# Patient Record
Sex: Female | Born: 1980 | Race: Black or African American | Hispanic: No | Marital: Married | Smoking: Never smoker
Health system: Southern US, Community
[De-identification: ages and names within clinical notes are randomized; demographics above are authoritative.]

## PROBLEM LIST (undated history)

## (undated) ENCOUNTER — Inpatient Hospital Stay (HOSPITAL_COMMUNITY): Payer: Self-pay

## (undated) DIAGNOSIS — O26899 Other specified pregnancy related conditions, unspecified trimester: Secondary | ICD-10-CM

## (undated) DIAGNOSIS — R12 Heartburn: Secondary | ICD-10-CM

## (undated) DIAGNOSIS — R002 Palpitations: Secondary | ICD-10-CM

## (undated) DIAGNOSIS — D649 Anemia, unspecified: Secondary | ICD-10-CM

## (undated) HISTORY — DX: Palpitations: R00.2

---

## 2001-05-12 ENCOUNTER — Emergency Department (HOSPITAL_COMMUNITY): Admission: EM | Admit: 2001-05-12 | Discharge: 2001-05-12 | Payer: Self-pay | Admitting: Internal Medicine

## 2004-01-22 ENCOUNTER — Inpatient Hospital Stay (HOSPITAL_COMMUNITY): Admission: AD | Admit: 2004-01-22 | Discharge: 2004-01-26 | Payer: Self-pay | Admitting: Obstetrics and Gynecology

## 2006-02-12 ENCOUNTER — Emergency Department (HOSPITAL_COMMUNITY): Admission: EM | Admit: 2006-02-12 | Discharge: 2006-02-12 | Payer: Self-pay | Admitting: Emergency Medicine

## 2006-11-05 ENCOUNTER — Encounter: Payer: Self-pay | Admitting: Obstetrics & Gynecology

## 2006-11-05 ENCOUNTER — Inpatient Hospital Stay (HOSPITAL_COMMUNITY): Admission: AD | Admit: 2006-11-05 | Discharge: 2006-11-08 | Payer: Self-pay | Admitting: Obstetrics & Gynecology

## 2010-03-14 ENCOUNTER — Other Ambulatory Visit: Payer: Self-pay | Admitting: Obstetrics & Gynecology

## 2010-03-14 ENCOUNTER — Other Ambulatory Visit (HOSPITAL_COMMUNITY)
Admission: RE | Admit: 2010-03-14 | Discharge: 2010-03-14 | Disposition: A | Discharge status: Home / Self Care | Payer: BC Managed Care – PPO | Source: Ambulatory Visit | Attending: Obstetrics & Gynecology | Admitting: Obstetrics & Gynecology

## 2010-03-14 DIAGNOSIS — Z113 Encounter for screening for infections with a predominantly sexual mode of transmission: Secondary | ICD-10-CM | POA: Insufficient documentation

## 2010-03-14 DIAGNOSIS — Z01419 Encounter for gynecological examination (general) (routine) without abnormal findings: Secondary | ICD-10-CM | POA: Insufficient documentation

## 2010-05-28 NOTE — Discharge Summary (Signed)
Denise Cochran, Denise Cochran               ACCOUNT NO.:  [id number]   MEDICAL RECORD NO.:  [id number]          PATIENT TYPE:  INP   LOCATION:  9133                          FACILITY:  [facility]   PHYSICIAN:  Lazaro Arms, M.D.   DATE OF BIRTH:  March 20, 1980   DATE OF ADMISSION:  11/05/2006  DATE OF DISCHARGE:  11/08/2006                               DISCHARGE SUMMARY   PROCEDURE:  Patient had a repeat cesarean section by Dr. Despina Hidden in which  she delivered a viable female infant through low transverse cesarean  section.   HOSPITAL COURSE:  Was uneventful.   LABS:  Hemoglobin on October 25 was 9 and hematocrit 26.   PHYSICAL EXAM TODAY:  VITAL SIGNS:  Stable.  HEART:  Regular rhythm and rate.  LUNGS:  Clear to auscultation bilaterally.  ABDOMEN:  Soft, nontender with bowel sounds noted in all four quadrants.  Incision is dry, intact; no redness, swelling or discharge.  FUNDUS:  Firm, at -2 __________ .  There was a scant amount of lochia.  DTRs are 2+.  There is trace edema in lower extremities.   DISCHARGE MEDICATIONS:  Are as follows:  1. Repliva one p.o. daily.  2. Lortab 5/500 one p.o. q.4 hours p.r.n. pain.  3. Motrin 600 one p.o. q.6 to 8 hours p.r.n. cramping.   ASSESSMENT:  Anemia status post cesarean section; stable, postoperative  day three.   PLAN:  We are going to discharge her home.  She is to follow up at  [facility] on Thursday, October 30 for staple removal.  She knows to  call the office if there are any problems.      Zerita Boers, N.M.      Lazaro Arms, M.D.  Electronically Signed    DL/MEDQ  D:  16/10/9602  T:  11/08/2006  Job:  [id number]

## 2010-05-28 NOTE — H&P (Signed)
NAME:  Denise Cochran, Denise Cochran               ACCOUNT NO.:  [id number]   MEDICAL RECORD NO.:  [id number]          PATIENT TYPE:  INP   LOCATION:  NA                            [facility]   PHYSICIAN:  Lazaro Arms, M.D.   DATE OF BIRTH:  1980-09-12   DATE OF ADMISSION:  DATE OF DISCHARGE:  LH                              HISTORY & PHYSICAL   HISTORY OF THE PRESENT ILLNESS:  The patient is a 30 year old African-  American female, gravida 2, para 1, abortus 0, with an expected date of  delivery of November 09, 2006 at 39.[redacted] weeks gestation who is admitted for  repeat C section.  I had to performed a C section on her back in January  2006 for secondary arrest of dilatation and descent of a 7-pound 13-  ounce infant.  This pregnancy has been uncomplicated.  She really has  had no problem  throughout this pregnancy at all.   PAST MEDICAL HISTORY:  The past medical history is negative.   PAST SURGICAL HISTORY:  Cesarean section.   PAST OBSTETRICAL HISTORY:  Cesarean section.   ALLERGIES:  None.   MEDICATIONS:  Prenatal vitamins.   REVIEW OF SYSTEMS:  The review of systems is negative.   PRENATAL LABORATORY DATA:  Blood type is O positive.  Varicella immune.  Rubella immune.  Urine drug screen was negative.  Hepatitis B was  negative.  HIV was nonreactive.  HSV was negative times two for one and  two.  Human papillomavirus was negative.  Serology nonreactive.  Pap was  normal.  GC and Chlamydia were negative time two.  AFP was normal.  Group B Strep was negative.  Glucola was 76.   PHYSICAL EXAMINATION:  HEENT:  The head, eyes, ears, nose and throat are  negative.  NECK:  The thyroid is normal.  LUNGS:  The lungs are clear.  HEART:  The heart shows a regular rate and rhythm without regurg or  gallop.  BREASTS:  The breasts are without mass or skin changes.  ABDOMEN:  The abdominal height is 38 cm.  VAGINAL EXAMINATION:  The cervix is long, thick and closed, posterior.  EXTREMITIES:   The extremities are warm with 1+ edema.  NEUROLOGIC:  The neurological exam is grossly intact.   IMPRESSION:  1. Intrauterine pregnancy at 39.[redacted] weeks gestation.  2. Previous cesarean section.  3. Declines trial of labor.   PLAN:  The patient is admitted for a repeat cesarean section.  She  understands the risks, benefits, indications and alternatives, and we  will proceed.      Lazaro Arms, M.D.  Electronically Signed     LHE/MEDQ  D:  11/04/2006  T:  11/05/2006  Job:  [id number]

## 2010-05-28 NOTE — Op Note (Signed)
Denise Cochran, Denise Cochran               ACCOUNT NO.:  [id number]   MEDICAL RECORD NO.:  [id number]          PATIENT TYPE:  INP   LOCATION:  9199                          [facility]ACILITY:  [facility]   PHYSICIAN:  Lazaro Arms, M.D.   DATE OF BIRTH:  September 03, 1980   DATE OF PROCEDURE:  11/05/2006  DATE OF DISCHARGE:                               OPERATIVE REPORT   PREOPERATIVE DIAGNOSIS:  1. Intrauterine pregnancy at 23 1/[redacted] weeks gestation.  2. Previous cesarean section.  3. Declines a trial of labor.   POSTOPERATIVE DIAGNOSIS:  1. Intrauterine pregnancy at 80 1/[redacted] weeks gestation.  2. Previous cesarean section.  3. Declines a trial of labor.   PROCEDURE:  Repeat cesarean section.   SURGEON:  Lazaro Arms, M.D.   ASSISTANT:  Dr. Yetta Barre   ANESTHESIA:  Spinal.   FINDINGS:  Over a low transverse hysterotomy incision was delivered a  viable female infant at 29 with Apgars of 8 and 9 with weight to be  determined in the nursery.  There was a three vessel cord.  Cord blood  and cord gas were sent.  Cord gas had a pH 7.25.  The uterus, tubes, and  ovaries were normal.   DESCRIPTION OF PROCEDURE:  The patient was taken to the operating room  and placed in the sitting position where she underwent a spinal  anesthetic.  She was  placed in a supine position and prepped and draped  in the usual sterile fashion.  A Pfannenstiel skin incision was made and  carried down sharply to the rectus fascia which was scored in the  midline and extended laterally.  The fascia was taken off the muscles  superiorly and inferiorly without difficulty.  The muscles were divided,  the peritoneal cavity was entered.  An Alexis self-retaining wound  retractor was placed.  A bladder blade was placed.  A vesicouterine  serosal flap was created.  A low transverse hysterotomy incision was  made.  Over this incision was delivered a viable female infant at 65  with Apgars of 8 and 9 weighing, I believe, 6 pounds 12 ounces.   There  was a three vessel cord.  Cord blood and cord gas were sent.  Cord pH  was 7.25.  The infant underwent routine neonatal resuscitation.  The  placenta was delivered spontaneously and sent to pathology for  evaluation.  The uterus was wiped clean with a clean lap pad.  It was  closed in two layers, the first being a running interlocking layer, the  second being an imbricating layer.  There were multiple figure-of-eight  sutures placed in a venous plexus on the right side with good hemostasis  as a result.  The pelvis was irrigated vigorously.  The muscles and  peritoneum were reapproximated loosely.  The fascia was closed using 0  Vicryl running, subcutaneous tissues were made hemostatic and irrigated,  the skin was closed using skin staples.  The  patient tolerated the procedure well.  She experienced 800 mL of blood  loss.  She was taken to the recovery room in good, stable condition.  All counts were correct x3.  She received Ancef prophylactically after  the cord was clamped.      Lazaro Arms, M.D.  Electronically Signed     LHE/MEDQ  D:  11/05/2006  T:  11/05/2006  Job:  [id number]

## 2010-05-31 NOTE — H&P (Signed)
NAMEBULA, CAVALIERI               ACCOUNT NO.:  [id number]   MEDICAL RECORD NO.:  [id number]          PATIENT TYPE:  INP   LOCATION:  A401                          FACILITY:  [facility]   PHYSICIAN:  Lazaro Arms, M.D.   DATE OF BIRTH:  May 10, 1980   DATE OF ADMISSION:  01/22/2003  DATE OF DISCHARGE:  LH                                HISTORY & PHYSICAL   CHIEF COMPLAINT:  Induction of labor due to term pregnancy and favorable  cervix.   HISTORY OF PRESENT ILLNESS:  Denise Cochran is a 30 year old, gravida 1, para 0,  with an EDC of January 25, 2004, based on a last menstrual period and  correlating first trimester ultrasound; secondary trimester ultrasound  placed EDC at January 18, 2004, placing her at approximately [redacted] weeks  gestation. Tamika began prenatal care early in her first trimester. She has  had basically an uneventful pregnancy course. Total weight gain has been 35  pounds with appropriate fundal height growth. Blood pressures have been  anywhere from the 120s to 140s over 70s to 90s, although the past few weeks  it has been in the 118/70 range.   PRENATAL LABORATORY:  Blood type O positive, rubella immune.  HBSAG, HIV,  RPR, gonorrhea, Chlamydia, HSV are all negative. She is positive for GBS.  One-hour GGT was normal. She is a sickle cell trait carrier. The status of  the father of the baby is unknown.   PAST MEDICAL HISTORY:  Positive for sickle cell AC trait.   PAST SURGICAL HISTORY:  Benign.   ALLERGIES:  No known drug allergies.   MEDICATIONS:  Prenatal vitamins.   SOCIAL HISTORY:  Single, lives with her mother, and works at a day care. The  father of the baby is involved and supportive.   FAMILY HISTORY:  Mother has hypertension. Paternal twin sister has sickle  cell disease.   PHYSICAL EXAMINATION:  HEENT: Within normal limits.  HEART: Regular rate and rhythm.  LUNGS: Clear.  ABDOMEN: Soft and nontender. Fundal height is 40 cm today. Estimated fetal  weight is  about 8 pounds. Cervix 1-2 cm, 50%, soft, -1 station, vertex  presentation.  EXTREMITIES: Trace edema.   IMPRESSION:  Intrauterine pregnancy at 40 weeks, induction of labor.   PLAN:  Foley bulb induction on January 9th with plan for Pitocin and AROM on  the 10th. The patient will probably have an epidural.     Rita   RRK/MEDQ  D:  01/18/2004  T:  01/18/2004  Job:  [id number]

## 2010-05-31 NOTE — Op Note (Signed)
NAMESUELLYN, MEENAN               ACCOUNT NO.:  [id number]   MEDICAL RECORD NO.:  [id number]          PATIENT TYPE:  INP   LOCATION:  LDR1                          FACILITY:  [facility]   PHYSICIAN:  Lazaro Arms, M.D.   DATE OF BIRTH:  Jan 27, 1980   DATE OF PROCEDURE:  01/23/2004  DATE OF DISCHARGE:                                 OPERATIVE REPORT   PROCEDURE:  Epidural placement.   SURGEON:  Lazaro Arms, M.D.   INDICATIONS FOR PROCEDURE:  The patient is a 30 year old gravida 1, in  active phase of labor, requesting an epidural be placed.   DESCRIPTION OF PROCEDURE:  The patient is placed in the sitting position.  The L2-L3 interspace is identified.  A Betadine prep was used.  Lidocaine 1%  was injected as a local anesthetic.  A #17 gauge Tuohy needle was used with  a loss of resistance technique employed, and the epidural space was found  with one pass without difficulty.  Bupivacaine 0.125%, 10 mL, was given as a  test dose without ill effects.  The epidural catheter is fed through the  tubing needle 5 cm in the epidural space.  An additional 10 mL of 0.125%  bupivacaine is given as a bolus.  The patient tolerated that as well.  It  was taped down 5 cm into the epidural space.  Continuous infusion of 0.125%  bupivacaine in 2 mcg of fentanyl was begun at 12 mL per hour.  The patient  had no ill effects.  The fetal heart rate tracing was stable.     Luth   LHE/MEDQ  D:  01/23/2004  T:  01/23/2004  Job:  [id number]

## 2010-05-31 NOTE — Op Note (Signed)
[name], Denise Cochran               ACCOUNT NO.:  [id number]   MEDICAL RECORD NO.:  [id number]          PATIENT TYPE:  INP   LOCATION:  A401                          FACILITY:  [facility]   PHYSICIAN:  Lazaro Arms, M.D.   DATE OF BIRTH:  1980/01/28   DATE OF PROCEDURE:  01/23/2004  DATE OF DISCHARGE:  01/26/2004                                 OPERATIVE REPORT   PREOPERATIVE DIAGNOSES:  1.  Intrauterine pregnancy at 40 weeks' gestation.  2.  Secondary arrest of dilatation and descent in the first stage of labor.   POSTOPERATIVE DIAGNOSES:  1.  Intrauterine pregnancy at 40 weeks' gestation.  2.  Secondary arrest of dilatation and descent in the first stage of labor.   PROCEDURE:  Primary low transverse cesarean section.   SURGEON:  Lazaro Arms, M.D.   ANESTHESIA:  Epidural.   The patient got to 5, 90, -1, and did not proceed any further.  As a result,  I proceeded with a primary C-section.  She delivered a viable infant with  Apgars of 8 and 9, weight to be determined in the nursery.  There was a  three-vessel cord.  Both cord blood and cord gas were sent.  The placenta  was normal and intact.  The uterus, tubes, and ovaries were all normal.   DESCRIPTION OF OPERATION:  The patient was taken to the operating room and  placed in the supine position with a roll under her right hip.  She had an  epidural that was working.  Betadine prep was used.  A Pfannenstiel skin  incision was made, carried down sharply to the rectus fascia, which was  scored in the midline and extended laterally.  The fascia was taken off the  muscle superiorly and inferiorly.  Muscles were divided, peritoneal cavity  was entered, bladder blade was placed.  A vesicouterine serosal flap was  created.  A low transverse hysterotomy incision.  Over the incision was  delivered a viable infant, Apgars 8 and 9, weight to be determined in the  nursery.  There was a three-vessel cord.  Cord blood, cord gas was sent.  The pediatrician was in attendance for routine neonatal resuscitation.  The  uterus was exteriorized, placenta delivered.  The uterus was closed in two  layers, the first being a running interlocking layer, the second being an  imbricating layer.  The uterus was replaced in the peritoneal cavity.  The  pericolic gutters were irrigated and all pedicles hemostatic.  The muscles  and peritoneum reapproximated  loosely, the fascia closed using 0 Vicryl running, the subcutaneous tissues  made hemostatic and irrigated.  The skin was closed using skin staples.  The  patient tolerated the procedure well.  She experienced 700 mL of blood loss  and was taken to recovery in good, stable condition.      LHE/MEDQ  D:  02/21/2004  T:  02/21/2004  Job:  [id number]

## 2010-05-31 NOTE — Discharge Summary (Signed)
[name], [name]               ACCOUNT NO.:  [id number]   MEDICAL RECORD NO.:  [id number]          PATIENT TYPE:  INP   LOCATION:  A401                          FACILITY:  [facility]   PHYSICIAN:  Lazaro Arms, M.D.   DATE OF BIRTH:  Jul 14, 1980   DATE OF ADMISSION:  01/22/2004  DATE OF DISCHARGE:  01/13/2006LH                                 DISCHARGE SUMMARY   DISCHARGE DIAGNOSES:  1.  Status post secondary arrested dilatation of descent.  2.  Status post a primary low transverse cesarean section.  3.  Unremarkable postoperative course.   PROCEDURE:  As above.   Please refer to the transcribed history and physical and the antepartum  chart for details of admission to the hospital.   HOSPITAL COURSE:  The patient was admitted for induction of labor with a  favorable cervix at 40 weeks.  She had secondary arrested dilatation of  descent in the first stage of labor and underwent a C-section.  This went  without difficulty.  Postoperative course was unremarkable. She was anemic  postoperatively, but she was anemic preoperatively.  She was asymptomatic  with the hemoglobin and hematocrit of 7.5 and 21.4.  She did have some  uterine hypertonia.  She remained afebrile. She tolerated clear liquids and  a regular diet.  She was ambulatory.  She was discharged to home on the  morning of postoperative day #3 in good stable condition to followup in the  office next week to have her staples removed.  She was given Tylox, Keflex,  and motrin.      LHE/MEDQ  D:  02/21/2004  T:  02/21/2004  Job:  [id number]

## 2010-10-23 LAB — URINALYSIS, MICROSCOPIC ONLY
Glucose, UA: NEGATIVE
Hgb urine dipstick: NEGATIVE
Leukocytes, UA: NEGATIVE
Protein, ur: NEGATIVE
Urobilinogen, UA: 0.2

## 2010-10-23 LAB — CBC
HCT: 24.7 — ABNORMAL LOW
HCT: 26.4 — ABNORMAL LOW
Hemoglobin: 8.7 — ABNORMAL LOW
Hemoglobin: 9 — ABNORMAL LOW
MCHC: 34.5
MCV: 81.4
MCV: 81.5
Platelets: 296
RBC: 3.04 — ABNORMAL LOW
RBC: 3.77 — ABNORMAL LOW
RDW: 14.2 — ABNORMAL HIGH
RDW: 14.7 — ABNORMAL HIGH
WBC: 12.4 — ABNORMAL HIGH
WBC: 13.6 — ABNORMAL HIGH

## 2010-10-23 LAB — ABO/RH: ABO/RH(D): O POS

## 2010-10-23 LAB — RPR: RPR Ser Ql: NONREACTIVE

## 2010-10-23 LAB — TYPE AND SCREEN
ABO/RH(D): O POS
Antibody Screen: NEGATIVE

## 2011-10-16 ENCOUNTER — Other Ambulatory Visit: Payer: Self-pay | Admitting: Obstetrics and Gynecology

## 2011-11-17 LAB — OB RESULTS CONSOLE HEPATITIS B SURFACE ANTIGEN: Hepatitis B Surface Ag: NEGATIVE

## 2011-11-17 LAB — OB RESULTS CONSOLE GC/CHLAMYDIA: Gonorrhea: NEGATIVE

## 2011-11-17 LAB — OB RESULTS CONSOLE ABO/RH: RH Type: POSITIVE

## 2011-11-17 LAB — OB RESULTS CONSOLE RPR: RPR: NONREACTIVE

## 2012-03-15 ENCOUNTER — Inpatient Hospital Stay (HOSPITAL_COMMUNITY)
Admission: AD | Admit: 2012-03-15 | Discharge: 2012-03-15 | Disposition: A | Discharge status: Home / Self Care | Payer: BC Managed Care – PPO | Source: Ambulatory Visit | Attending: Obstetrics and Gynecology | Admitting: Obstetrics and Gynecology

## 2012-03-15 ENCOUNTER — Encounter (HOSPITAL_COMMUNITY): Payer: Self-pay

## 2012-03-15 DIAGNOSIS — O479 False labor, unspecified: Secondary | ICD-10-CM

## 2012-03-15 DIAGNOSIS — O4700 False labor before 37 completed weeks of gestation, unspecified trimester: Secondary | ICD-10-CM | POA: Insufficient documentation

## 2012-03-15 DIAGNOSIS — O30009 Twin pregnancy, unspecified number of placenta and unspecified number of amniotic sacs, unspecified trimester: Secondary | ICD-10-CM | POA: Insufficient documentation

## 2012-03-15 HISTORY — DX: Anemia, unspecified: D64.9

## 2012-03-15 LAB — FETAL FIBRONECTIN: Fetal Fibronectin: NEGATIVE

## 2012-03-15 LAB — URINALYSIS, ROUTINE W REFLEX MICROSCOPIC
Glucose, UA: NEGATIVE mg/dL
Leukocytes, UA: NEGATIVE
Nitrite: NEGATIVE
Urobilinogen, UA: 0.2 mg/dL (ref 0.0–1.0)
pH: 6 (ref 5.0–8.0)

## 2012-03-15 NOTE — MAU Provider Note (Signed)
History     CSN: [id number]  Arrival date and time: 03/15/12 1239   First Provider Initiated Contact with Patient 03/15/12 1335      Chief Complaint  Patient presents with  . Contractions   HPI 32 y.o. [id number] at [redacted]w[redacted]d with twin pregnancy with contractions, irregular since yesterday, more consistent since this morning. No bleeding or discharge. Uncomplicated prenatal course. H/O two prior term c/s.   Past Medical History  Diagnosis Date  . Anemia     Past Surgical History  Procedure Laterality Date  . Cesarean section      History reviewed. No pertinent family history.  History  Substance Use Topics  . Smoking status: Never Smoker   . Smokeless tobacco: Not on file  . Alcohol Use: No    Allergies: No Known Allergies  No prescriptions prior to admission    Review of Systems  Constitutional: Negative.   Respiratory: Negative.   Cardiovascular: Negative.   Gastrointestinal: Negative for nausea, vomiting, abdominal pain, diarrhea and constipation.  Genitourinary: Negative for dysuria, urgency, frequency, hematuria and flank pain.       Negative for vaginal bleeding, Positive cramping/contractions  Musculoskeletal: Negative.   Neurological: Negative.   Psychiatric/Behavioral: Negative.    Physical Exam   Blood pressure 121/59, pulse 103, temperature 98.2 F (36.8 C), temperature source Oral, resp. rate 18, height 5\' 5"  (1.651 m), weight 227 lb (102.967 kg), SpO2 98.00%.  Physical Exam  Nursing note and vitals reviewed. Constitutional: She is oriented to person, place, and time. She appears well-developed and well-nourished. No distress.  Cardiovascular: Normal rate.   Respiratory: Effort normal.  GI: Soft. There is no tenderness.  Genitourinary: No bleeding around the vagina. Vaginal discharge (thick, white, nonmalodorous) found.  Dilation: Closed Effacement (%): Thick Cervical Position: Posterior Exam by::  Telford Nab, CNM)   Musculoskeletal: Normal  range of motion.  Neurological: She is alert and oriented to person, place, and time.  Skin: Skin is warm and dry.  Psychiatric: She has a normal mood and affect.   EFM reactive at 28 weeks x 2, TOCO: irregular UC, 2-10 min, irritability MAU Course  Procedures  Results for orders placed during the hospital encounter of 03/15/12 (from the past 24 hour(s))  URINALYSIS, ROUTINE W REFLEX MICROSCOPIC     Status: Abnormal   Collection Time    03/15/12 12:55 PM      Result Value Range   Color, Urine YELLOW  YELLOW   APPearance CLEAR  CLEAR   Specific Gravity, Urine <1.005 (*) 1.005 - 1.030   pH 6.0  5.0 - 8.0   Glucose, UA NEGATIVE  NEGATIVE mg/dL   Hgb urine dipstick NEGATIVE  NEGATIVE   Bilirubin Urine NEGATIVE  NEGATIVE   Ketones, ur NEGATIVE  NEGATIVE mg/dL   Protein, ur NEGATIVE  NEGATIVE mg/dL   Urobilinogen, UA 0.2  0.0 - 1.0 mg/dL   Nitrite NEGATIVE  NEGATIVE   Leukocytes, UA NEGATIVE  NEGATIVE  WET PREP, GENITAL     Status: Abnormal   Collection Time    03/15/12  1:33 PM      Result Value Range   Yeast Wet Prep HPF POC NONE SEEN  NONE SEEN   Trich, Wet Prep NONE SEEN  NONE SEEN   Clue Cells Wet Prep HPF POC NONE SEEN  NONE SEEN   WBC, Wet Prep HPF POC FEW (*) NONE SEEN  FETAL FIBRONECTIN     Status: None   Collection Time    03/15/12  1:33 PM      Result Value Range   Fetal Fibronectin NEGATIVE  NEGATIVE   Recheck of cervix after > 1 hour observation - no change  Assessment and Plan   1. Preterm contractions   Negative FFN with closed cervix - rest and hydrate at home today, return with increased UCs, bleeding LOF    Medication List    TAKE these medications       folic acid 400 MCG tablet  Commonly known as:  FOLVITE  Take 400 mcg by mouth daily.     prenatal multivitamin Tabs  Take 1 tablet by mouth daily at 12 noon.            Follow-up Information   Follow up with Philip Aspen, DO. (as scheduled or sooner as needed)    Contact information:    [address] [place] [address] [phone]         [facility] 03/15/2012, 5:06 PM

## 2012-03-15 NOTE — MAU Note (Signed)
Has been having contractions for about 2 weeks started getting worse yesterday, denies vaginal bleeding, good FM, twin gestation.

## 2012-05-06 ENCOUNTER — Encounter (HOSPITAL_COMMUNITY): Payer: Self-pay | Admitting: Pharmacist

## 2012-05-17 ENCOUNTER — Encounter (HOSPITAL_COMMUNITY): Payer: Self-pay

## 2012-05-18 ENCOUNTER — Encounter (HOSPITAL_COMMUNITY)
Admission: RE | Admit: 2012-05-18 | Discharge: 2012-05-18 | Disposition: A | Discharge status: Home / Self Care | Payer: BC Managed Care – PPO | Source: Ambulatory Visit | Attending: Obstetrics & Gynecology | Admitting: Obstetrics & Gynecology

## 2012-05-18 ENCOUNTER — Encounter (HOSPITAL_COMMUNITY): Payer: Self-pay

## 2012-05-18 HISTORY — DX: Other specified pregnancy related conditions, unspecified trimester: O26.899

## 2012-05-18 HISTORY — DX: Heartburn: R12

## 2012-05-18 LAB — RPR: RPR Ser Ql: NONREACTIVE

## 2012-05-18 LAB — CBC
HCT: 27 % — ABNORMAL LOW (ref 36.0–46.0)
Hemoglobin: 9.3 g/dL — ABNORMAL LOW (ref 12.0–15.0)
MCH: 25.1 pg — ABNORMAL LOW (ref 26.0–34.0)
MCV: 73 fL — ABNORMAL LOW (ref 78.0–100.0)
Platelets: 221 10*3/uL (ref 150–400)
RDW: 16.5 % — ABNORMAL HIGH (ref 11.5–15.5)
WBC: 9.9 10*3/uL (ref 4.0–10.5)

## 2012-05-18 NOTE — Patient Instructions (Signed)
Your procedure is scheduled on:05/20/12  Enter through the Main Entrance at :11am Pick up desk phone and dial [phone] and inform us of your arrival.  Please call [phone] if you have any problems the morning of surgery.  Remember: Do not eat after midnight- ok to drink clear liquids until 0830 am Thursday  DO NOT wear jewelry, eye make-up, lipstick,body lotion, or dark fingernail polish.   If you are to be admitted after surgery, leave suitcase in car until your room has been assigned.

## 2012-05-19 NOTE — H&P (Signed)
32 y.o. [id number]  Estimated Date of Delivery: 06/03/12 admitted at [redacted] weeks gestation for repeat cesarean.  Patient has a history of 2 previous cesarean deliveries.  She has mono/di twin girls in this pregnancy.  She desires tubal sterilization.  Prenatal Transfer Tool  Maternal Diabetes: No Genetic Screening: Normal Maternal Ultrasounds/Referrals: Normal Fetal Ultrasounds or other Referrals:  None Maternal Substance Abuse:  No Significant Maternal Medications:  None Significant Maternal Lab Results: None Other Significant Pregnancy Complications:  Monochorionic, diamniotic twin gestation.  Concordant growth on ultrasound.  Afebrile, VSS Heart and Lungs: No active disease Abdomen: soft, gravid, EFW AGA condordant twins. Cervical exam:  Deferred.  Impression: Previous cesarean deliveries x 2.  Twin gestation.  Desires sterilization.  Plan:  Cesarean, Tubal occlusion.

## 2012-05-20 ENCOUNTER — Encounter (HOSPITAL_COMMUNITY): Payer: Self-pay | Admitting: Anesthesiology

## 2012-05-20 ENCOUNTER — Encounter (HOSPITAL_COMMUNITY): Payer: Self-pay | Admitting: Certified Registered"

## 2012-05-20 ENCOUNTER — Inpatient Hospital Stay (HOSPITAL_COMMUNITY)
Admission: RE | Admit: 2012-05-20 | Discharge: 2012-05-23 | DRG: 370 | Disposition: A | Discharge status: Home / Self Care | Payer: BC Managed Care – PPO | Source: Ambulatory Visit | Attending: Obstetrics & Gynecology | Admitting: Obstetrics & Gynecology

## 2012-05-20 ENCOUNTER — Inpatient Hospital Stay (HOSPITAL_COMMUNITY): Payer: BC Managed Care – PPO | Admitting: Anesthesiology

## 2012-05-20 ENCOUNTER — Encounter (HOSPITAL_COMMUNITY)
Admission: RE | Disposition: A | Discharge status: Home / Self Care | Payer: Self-pay | Source: Ambulatory Visit | Attending: Obstetrics & Gynecology

## 2012-05-20 DIAGNOSIS — Z302 Encounter for sterilization: Secondary | ICD-10-CM

## 2012-05-20 DIAGNOSIS — O99893 Other specified diseases and conditions complicating puerperium: Secondary | ICD-10-CM | POA: Diagnosis not present

## 2012-05-20 DIAGNOSIS — O34219 Maternal care for unspecified type scar from previous cesarean delivery: Principal | ICD-10-CM | POA: Diagnosis present

## 2012-05-20 DIAGNOSIS — O30009 Twin pregnancy, unspecified number of placenta and unspecified number of amniotic sacs, unspecified trimester: Secondary | ICD-10-CM | POA: Diagnosis present

## 2012-05-20 DIAGNOSIS — R569 Unspecified convulsions: Secondary | ICD-10-CM | POA: Diagnosis not present

## 2012-05-20 DIAGNOSIS — O30039 Twin pregnancy, monochorionic/diamniotic, unspecified trimester: Secondary | ICD-10-CM

## 2012-05-20 HISTORY — PX: TUBAL LIGATION: SHX77

## 2012-05-20 HISTORY — PX: DILATION AND EVACUATION: SHX1459

## 2012-05-20 LAB — PREPARE RBC (CROSSMATCH)

## 2012-05-20 LAB — CBC
MCHC: 35.2 g/dL (ref 30.0–36.0)
RDW: 19.1 % — ABNORMAL HIGH (ref 11.5–15.5)

## 2012-05-20 SURGERY — DILATION AND EVACUATION, UTERUS
Anesthesia: Monitor Anesthesia Care | Site: Vagina | Wound class: Clean Contaminated

## 2012-05-20 SURGERY — Surgical Case
Anesthesia: Spinal | Wound class: Clean Contaminated

## 2012-05-20 MED ORDER — DIBUCAINE 1 % RE OINT: 1.0000 "application " | TOPICAL_OINTMENT | RECTAL | Status: DC | PRN

## 2012-05-20 MED ORDER — KETOROLAC TROMETHAMINE 30 MG/ML IJ SOLN: 30.0000 mg | Freq: Four times a day (QID) | INTRAMUSCULAR | Status: AC | PRN

## 2012-05-20 MED ORDER — METHYLERGONOVINE MALEATE 0.2 MG PO TABS: 0.2000 mg | ORAL_TABLET | ORAL | Status: DC | PRN

## 2012-05-20 MED ORDER — OXYTOCIN 40 UNITS IN LACTATED RINGERS INFUSION - SIMPLE MED
62.5000 mL/h | INTRAVENOUS | Status: DC
  Administered 2012-05-20: 62.5 mL/h via INTRAVENOUS

## 2012-05-20 MED ORDER — NALOXONE HCL 0.4 MG/ML IJ SOLN: 0.4000 mg | INTRAMUSCULAR | Status: DC | PRN

## 2012-05-20 MED ORDER — SODIUM CHLORIDE 0.9 % IV SOLN
INTRAVENOUS | Status: DC | PRN
  Administered 2012-05-20: 17:00:00 via INTRAVENOUS

## 2012-05-20 MED ORDER — DEXTROSE 5 % IV SOLN
3.0000 g | INTRAVENOUS | Status: AC
  Administered 2012-05-20: 2 g via INTRAVENOUS
  Filled 2012-05-20: qty 3000

## 2012-05-20 MED ORDER — OXYCODONE-ACETAMINOPHEN 5-325 MG PO TABS: 1.0000 | ORAL_TABLET | ORAL | Status: DC | PRN

## 2012-05-20 MED ORDER — MIDAZOLAM HCL 2 MG/2ML IJ SOLN: 0.5000 mg | Freq: Once | INTRAMUSCULAR | Status: DC | PRN

## 2012-05-20 MED ORDER — PRENATAL MULTIVITAMIN CH
1.0000 | ORAL_TABLET | Freq: Every day | ORAL | Status: DC
  Administered 2012-05-21 – 2012-05-23 (×3): 1 via ORAL
  Filled 2012-05-20 (×3): qty 1

## 2012-05-20 MED ORDER — IBUPROFEN 600 MG PO TABS
600.0000 mg | ORAL_TABLET | Freq: Four times a day (QID) | ORAL | Status: DC
  Administered 2012-05-21 – 2012-05-23 (×11): 600 mg via ORAL
  Filled 2012-05-20 (×11): qty 1

## 2012-05-20 MED ORDER — MENTHOL 3 MG MT LOZG: 1.0000 | LOZENGE | OROMUCOSAL | Status: DC | PRN

## 2012-05-20 MED ORDER — LANOLIN HYDROUS EX OINT: 1.0000 "application " | TOPICAL_OINTMENT | CUTANEOUS | Status: DC | PRN

## 2012-05-20 MED ORDER — DIPHENHYDRAMINE HCL 50 MG/ML IJ SOLN: 25.0000 mg | INTRAMUSCULAR | Status: DC | PRN

## 2012-05-20 MED ORDER — LACTATED RINGERS IV SOLN: INTRAVENOUS | Status: DC

## 2012-05-20 MED ORDER — DIPHENHYDRAMINE HCL 25 MG PO CAPS: 25.0000 mg | ORAL_CAPSULE | Freq: Four times a day (QID) | ORAL | Status: DC | PRN

## 2012-05-20 MED ORDER — SODIUM CHLORIDE 0.9 % IJ SOLN: 3.0000 mL | INTRAMUSCULAR | Status: DC | PRN

## 2012-05-20 MED ORDER — WITCH HAZEL-GLYCERIN EX PADS: 1.0000 "application " | MEDICATED_PAD | CUTANEOUS | Status: DC | PRN

## 2012-05-20 MED ORDER — PHENYLEPHRINE HCL 10 MG/ML IJ SOLN
INTRAMUSCULAR | Status: DC | PRN
  Administered 2012-05-20: 40 mg via INTRAVENOUS

## 2012-05-20 MED ORDER — ONDANSETRON HCL 4 MG/2ML IJ SOLN: 4.0000 mg | Freq: Three times a day (TID) | INTRAMUSCULAR | Status: DC | PRN

## 2012-05-20 MED ORDER — LACTATED RINGERS IV SOLN
INTRAVENOUS | Status: DC | PRN
  Administered 2012-05-20 (×3): via INTRAVENOUS

## 2012-05-20 MED ORDER — OXYTOCIN 40 UNITS IN LACTATED RINGERS INFUSION - SIMPLE MED
125.0000 mL/h | INTRAVENOUS | Status: DC
  Administered 2012-05-20: 125 mL/h via INTRAVENOUS

## 2012-05-20 MED ORDER — PRENATAL MULTIVITAMIN CH: 1.0000 | ORAL_TABLET | Freq: Every day | ORAL | Status: DC

## 2012-05-20 MED ORDER — FENTANYL CITRATE 0.05 MG/ML IJ SOLN
INTRAMUSCULAR | Status: DC | PRN
  Administered 2012-05-20: 12.5 ug via INTRATHECAL

## 2012-05-20 MED ORDER — SIMETHICONE 80 MG PO CHEW: 80.0000 mg | CHEWABLE_TABLET | ORAL | Status: DC | PRN

## 2012-05-20 MED ORDER — 0.9 % SODIUM CHLORIDE (POUR BTL) OPTIME
TOPICAL | Status: DC | PRN
  Administered 2012-05-20: 1000 mL

## 2012-05-20 MED ORDER — ONDANSETRON HCL 4 MG/2ML IJ SOLN
INTRAMUSCULAR | Status: AC
  Filled 2012-05-20: qty 2

## 2012-05-20 MED ORDER — NALOXONE HCL 1 MG/ML IJ SOLN: 1.0000 ug/kg/h | INTRAVENOUS | Status: DC | PRN

## 2012-05-20 MED ORDER — OXYTOCIN 10 UNIT/ML IJ SOLN
40.0000 [IU] | INTRAMUSCULAR | Status: DC | PRN
  Administered 2012-05-20: 40 [IU] via INTRAVENOUS

## 2012-05-20 MED ORDER — MORPHINE SULFATE (PF) 0.5 MG/ML IJ SOLN
INTRAMUSCULAR | Status: DC | PRN
  Administered 2012-05-20: .2 mg via INTRATHECAL

## 2012-05-20 MED ORDER — OXYTOCIN 40 UNITS IN LACTATED RINGERS INFUSION - SIMPLE MED: 250.0000 mL/h | INTRAVENOUS | Status: DC

## 2012-05-20 MED ORDER — OXYCODONE-ACETAMINOPHEN 5-325 MG PO TABS
1.0000 | ORAL_TABLET | ORAL | Status: DC | PRN
  Administered 2012-05-21 – 2012-05-22 (×3): 1 via ORAL
  Filled 2012-05-20 (×4): qty 1

## 2012-05-20 MED ORDER — PHENYLEPHRINE HCL 10 MG/ML IJ SOLN
INTRAMUSCULAR | Status: DC | PRN
  Administered 2012-05-20: 40 ug via INTRAVENOUS
  Administered 2012-05-20 (×2): 80 ug via INTRAVENOUS
  Administered 2012-05-20: 40 ug via INTRAVENOUS
  Administered 2012-05-20 (×2): 80 ug via INTRAVENOUS

## 2012-05-20 MED ORDER — OXYTOCIN 40 UNITS IN LACTATED RINGERS INFUSION - SIMPLE MED
INTRAVENOUS | Status: AC
  Filled 2012-05-20: qty 1000

## 2012-05-20 MED ORDER — SENNOSIDES-DOCUSATE SODIUM 8.6-50 MG PO TABS: 2.0000 | ORAL_TABLET | Freq: Every day | ORAL | Status: DC

## 2012-05-20 MED ORDER — OXYTOCIN 40 UNITS IN LACTATED RINGERS INFUSION - SIMPLE MED: 62.5000 mL/h | INTRAVENOUS | Status: DC

## 2012-05-20 MED ORDER — IBUPROFEN 600 MG PO TABS: 600.0000 mg | ORAL_TABLET | Freq: Four times a day (QID) | ORAL | Status: DC

## 2012-05-20 MED ORDER — LACTATED RINGERS IV SOLN
INTRAVENOUS | Status: DC
  Administered 2012-05-20: 23:00:00 via INTRAVENOUS

## 2012-05-20 MED ORDER — EPHEDRINE SULFATE 50 MG/ML IJ SOLN
INTRAMUSCULAR | Status: DC | PRN
  Administered 2012-05-20 (×3): 5 mg via INTRAVENOUS

## 2012-05-20 MED ORDER — OXYTOCIN 10 UNIT/ML IJ SOLN
INTRAMUSCULAR | Status: AC
  Filled 2012-05-20: qty 4

## 2012-05-20 MED ORDER — TETANUS-DIPHTH-ACELL PERTUSSIS 5-2.5-18.5 LF-MCG/0.5 IM SUSP: 0.5000 mL | Freq: Once | INTRAMUSCULAR | Status: DC

## 2012-05-20 MED ORDER — BUPIVACAINE IN DEXTROSE 0.75-8.25 % IT SOLN
INTRATHECAL | Status: DC | PRN
  Administered 2012-05-20: 1.4 mL via INTRATHECAL

## 2012-05-20 MED ORDER — PHENYLEPHRINE 40 MCG/ML (10ML) SYRINGE FOR IV PUSH (FOR BLOOD PRESSURE SUPPORT)
PREFILLED_SYRINGE | INTRAVENOUS | Status: AC
  Filled 2012-05-20: qty 5

## 2012-05-20 MED ORDER — KETOROLAC TROMETHAMINE 30 MG/ML IJ SOLN
INTRAMUSCULAR | Status: AC
  Administered 2012-05-20: 30 mg via INTRAVENOUS
  Filled 2012-05-20: qty 1

## 2012-05-20 MED ORDER — MISOPROSTOL 200 MCG PO TABS
ORAL_TABLET | ORAL | Status: AC
  Administered 2012-05-20: 800 ug via RECTAL
  Filled 2012-05-20: qty 4

## 2012-05-20 MED ORDER — SCOPOLAMINE 1 MG/3DAYS TD PT72: 1.0000 | MEDICATED_PATCH | Freq: Once | TRANSDERMAL | Status: DC

## 2012-05-20 MED ORDER — FENTANYL CITRATE 0.05 MG/ML IJ SOLN: 25.0000 ug | INTRAMUSCULAR | Status: DC | PRN

## 2012-05-20 MED ORDER — SENNOSIDES-DOCUSATE SODIUM 8.6-50 MG PO TABS
2.0000 | ORAL_TABLET | Freq: Every day | ORAL | Status: DC
  Administered 2012-05-20 – 2012-05-21 (×2): 2 via ORAL

## 2012-05-20 MED ORDER — NALBUPHINE HCL 10 MG/ML IJ SOLN: 5.0000 mg | INTRAMUSCULAR | Status: DC | PRN

## 2012-05-20 MED ORDER — CEFAZOLIN SODIUM-DEXTROSE 2-3 GM-% IV SOLR
INTRAVENOUS | Status: AC
  Filled 2012-05-20: qty 50

## 2012-05-20 MED ORDER — METOCLOPRAMIDE HCL 5 MG/ML IJ SOLN: 10.0000 mg | Freq: Three times a day (TID) | INTRAMUSCULAR | Status: DC | PRN

## 2012-05-20 MED ORDER — ACETAMINOPHEN 10 MG/ML IV SOLN: 1000.0000 mg | Freq: Four times a day (QID) | INTRAVENOUS | Status: AC | PRN

## 2012-05-20 MED ORDER — LACTATED RINGERS IV SOLN
INTRAVENOUS | Status: DC | PRN
  Administered 2012-05-20: 13:00:00 via INTRAVENOUS

## 2012-05-20 MED ORDER — ONDANSETRON HCL 4 MG PO TABS: 4.0000 mg | ORAL_TABLET | ORAL | Status: DC | PRN

## 2012-05-20 MED ORDER — ONDANSETRON HCL 4 MG/2ML IJ SOLN
INTRAMUSCULAR | Status: DC | PRN
  Administered 2012-05-20: 4 mg via INTRAVENOUS

## 2012-05-20 MED ORDER — DIPHENHYDRAMINE HCL 25 MG PO CAPS: 25.0000 mg | ORAL_CAPSULE | ORAL | Status: DC | PRN

## 2012-05-20 MED ORDER — ONDANSETRON HCL 4 MG/2ML IJ SOLN: 4.0000 mg | INTRAMUSCULAR | Status: DC | PRN

## 2012-05-20 MED ORDER — ZOLPIDEM TARTRATE 5 MG PO TABS: 5.0000 mg | ORAL_TABLET | Freq: Every evening | ORAL | Status: DC | PRN

## 2012-05-20 MED ORDER — LACTATED RINGERS IV SOLN
INTRAVENOUS | Status: DC | PRN
  Administered 2012-05-20: 17:00:00 via INTRAVENOUS

## 2012-05-20 MED ORDER — DIPHENHYDRAMINE HCL 50 MG/ML IJ SOLN: 12.5000 mg | INTRAMUSCULAR | Status: DC | PRN

## 2012-05-20 MED ORDER — LACTATED RINGERS IV SOLN: 125.0000 mL/h | INTRAVENOUS | Status: DC

## 2012-05-20 MED ORDER — FENTANYL CITRATE 0.05 MG/ML IJ SOLN
INTRAMUSCULAR | Status: AC
  Filled 2012-05-20: qty 2

## 2012-05-20 MED ORDER — TETANUS-DIPHTH-ACELL PERTUSSIS 5-2.5-18.5 LF-MCG/0.5 IM SUSP
0.5000 mL | Freq: Once | INTRAMUSCULAR | Status: AC
Start: 2012-05-21 — End: 2012-05-21
  Administered 2012-05-21: 0.5 mL via INTRAMUSCULAR
  Filled 2012-05-20: qty 0.5

## 2012-05-20 MED ORDER — MORPHINE SULFATE 0.5 MG/ML IJ SOLN
INTRAMUSCULAR | Status: AC
  Filled 2012-05-20: qty 10

## 2012-05-20 MED ORDER — PROMETHAZINE HCL 25 MG/ML IJ SOLN: 6.2500 mg | INTRAMUSCULAR | Status: DC | PRN

## 2012-05-20 MED ORDER — LACTATED RINGERS IV SOLN
INTRAVENOUS | Status: DC
Start: 2012-05-20 — End: 2012-05-20
  Administered 2012-05-20 (×3): via INTRAVENOUS

## 2012-05-20 MED ORDER — SCOPOLAMINE 1 MG/3DAYS TD PT72
MEDICATED_PATCH | TRANSDERMAL | Status: AC
  Administered 2012-05-20: 1.5 mg via TRANSDERMAL
  Filled 2012-05-20: qty 1

## 2012-05-20 MED ORDER — METHYLERGONOVINE MALEATE 0.2 MG/ML IJ SOLN: 0.2000 mg | INTRAMUSCULAR | Status: DC | PRN

## 2012-05-20 MED ORDER — PROPOFOL 10 MG/ML IV EMUL
INTRAVENOUS | Status: AC
  Filled 2012-05-20: qty 20

## 2012-05-20 MED ORDER — MEPERIDINE HCL 25 MG/ML IJ SOLN: 6.2500 mg | INTRAMUSCULAR | Status: DC | PRN

## 2012-05-20 MED ORDER — EPHEDRINE 5 MG/ML INJ
INTRAVENOUS | Status: AC
  Filled 2012-05-20: qty 10

## 2012-05-20 MED ORDER — METHYLERGONOVINE MALEATE 0.2 MG/ML IJ SOLN
0.2000 mg | Freq: Once | INTRAMUSCULAR | Status: AC
  Administered 2012-05-20: 0.2 mg via INTRAMUSCULAR

## 2012-05-20 MED ORDER — SIMETHICONE 80 MG PO CHEW: 80.0000 mg | CHEWABLE_TABLET | Freq: Three times a day (TID) | ORAL | Status: DC

## 2012-05-20 MED ORDER — MISOPROSTOL 200 MCG PO TABS: 800.0000 ug | ORAL_TABLET | Freq: Once | ORAL | Status: AC

## 2012-05-20 SURGICAL SUPPLY — 34 items
CLIP FILSHIE TUBAL LIGA STRL (Clip) ×1 IMPLANT
CLOTH BEACON ORANGE TIMEOUT ST (SAFETY) ×3 IMPLANT
CONTAINER PREFILL 10% NBF 15ML (MISCELLANEOUS) IMPLANT
DRAPE LG THREE QUARTER DISP (DRAPES) ×3 IMPLANT
DRSG OPSITE POSTOP 4X10 (GAUZE/BANDAGES/DRESSINGS) ×3 IMPLANT
DURAPREP 26ML APPLICATOR (WOUND CARE) ×3 IMPLANT
ELECT REM PT RETURN 9FT ADLT (ELECTROSURGICAL) ×3
ELECTRODE REM PT RTRN 9FT ADLT (ELECTROSURGICAL) ×2 IMPLANT
EXTRACTOR VACUUM M CUP 4 TUBE (SUCTIONS) IMPLANT
GLOVE ECLIPSE 6.0 STRL STRAW (GLOVE) ×3 IMPLANT
GLOVE ECLIPSE 6.5 STRL STRAW (GLOVE) ×3 IMPLANT
GOWN STRL REIN XL XLG (GOWN DISPOSABLE) ×6 IMPLANT
KIT ABG SYR 3ML LUER SLIP (SYRINGE) IMPLANT
NDL HYPO 25X5/8 SAFETYGLIDE (NEEDLE) ×1 IMPLANT
NEEDLE HYPO 25X5/8 SAFETYGLIDE (NEEDLE) ×3 IMPLANT
NS IRRIG 1000ML POUR BTL (IV SOLUTION) ×3 IMPLANT
PACK C SECTION WH (CUSTOM PROCEDURE TRAY) ×3 IMPLANT
PAD OB MATERNITY 4.3X12.25 (PERSONAL CARE ITEMS) ×3 IMPLANT
RTRCTR C-SECT PINK 25CM LRG (MISCELLANEOUS) ×1 IMPLANT
STAPLER VISISTAT 35W (STAPLE) IMPLANT
SUT PLAIN 0 NONE (SUTURE) IMPLANT
SUT VIC AB 0 CT1 27 (SUTURE) ×9
SUT VIC AB 0 CT1 27XBRD ANBCTR (SUTURE) ×6 IMPLANT
SUT VIC AB 1 CTX 36 (SUTURE) ×6
SUT VIC AB 1 CTX36XBRD ANBCTRL (SUTURE) ×4 IMPLANT
SUT VIC AB 3-0 CT1 27 (SUTURE) ×6
SUT VIC AB 3-0 CT1 TAPERPNT 27 (SUTURE) ×4 IMPLANT
SUT VIC AB 3-0 PS2 18 (SUTURE)
SUT VIC AB 3-0 PS2 18XBRD (SUTURE) IMPLANT
SUT VIC AB 3-0 SH 27 (SUTURE)
SUT VIC AB 3-0 SH 27X BRD (SUTURE) IMPLANT
TOWEL OR 17X24 6PK STRL BLUE (TOWEL DISPOSABLE) ×9 IMPLANT
TRAY FOLEY CATH 14FR (SET/KITS/TRAYS/PACK) ×3 IMPLANT
WATER STERILE IRR 1000ML POUR (IV SOLUTION) ×3 IMPLANT

## 2012-05-20 SURGICAL SUPPLY — 18 items
BALLN POSTPARTUM SOS BAKRI (BALLOONS) ×3
BALLOON POSTPARTUM SOS BAKRI (BALLOONS) ×1 IMPLANT
CATH ROBINSON RED A/P 16FR (CATHETERS) ×3 IMPLANT
CLOTH BEACON ORANGE TIMEOUT ST (SAFETY) ×3 IMPLANT
CONTAINER PREFILL 10% NBF 60ML (FORM) ×6 IMPLANT
DECANTER SPIKE VIAL GLASS SM (MISCELLANEOUS) ×3 IMPLANT
DRESSING TELFA 8X3 (GAUZE/BANDAGES/DRESSINGS) ×3 IMPLANT
GLOVE ECLIPSE 6.0 STRL STRAW (GLOVE) ×6 IMPLANT
GOWN STRL REIN XL XLG (GOWN DISPOSABLE) ×6 IMPLANT
NDL SPNL 22GX3.5 QUINCKE BK (NEEDLE) ×1 IMPLANT
NEEDLE SPNL 22GX3.5 QUINCKE BK (NEEDLE) ×3 IMPLANT
PACK VAGINAL MINOR WOMEN LF (CUSTOM PROCEDURE TRAY) ×3 IMPLANT
PAD OB MATERNITY 4.3X12.25 (PERSONAL CARE ITEMS) ×3 IMPLANT
PAD PREP 24X48 CUFFED NSTRL (MISCELLANEOUS) ×3 IMPLANT
SYR CONTROL 10ML LL (SYRINGE) ×3 IMPLANT
TOWEL OR 17X24 6PK STRL BLUE (TOWEL DISPOSABLE) ×6 IMPLANT
VACURETTE 10 RIGID CVD (CANNULA) ×3 IMPLANT
VACURETTE 12 RIGID CVD (CANNULA) ×2 IMPLANT

## 2012-05-20 NOTE — Lactation Note (Signed)
This note was copied from the chart of Denise Crystle Cochran. Lactation Consultation Note Assist mom with latch with both twins in PACU. 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  Reviewed basics with mom; both babies latched for a few sucks, but were mostly sleeping. Enc mom to continue frequent STS and cue based feeding. Enc mom to call for help when needed. Mom made aware of lactation services. Questions answered.   Patient Name: Denise Cochran Today's Date: 05/20/2012 Reason for consult: Initial assessment   Maternal Data Formula Feeding for Exclusion: No  Feeding Feeding Type: Breast Milk Feeding method: Breast  LATCH Score/Interventions Latch: Too sleepy or reluctant, no latch achieved, no sucking elicited. Intervention(s): Skin to skin;Teach feeding cues;Waking techniques                    Lactation Tools Discussed/Used     Consult Status Consult Status: Follow-up Follow-up type: In-patient    Denise Cochran 05/20/2012, 3:44 PM    

## 2012-05-20 NOTE — Transfer of Care (Signed)
Immediate Anesthesia Transfer of Care Note  Patient: Denise Cochran  Procedure(s) Performed: Procedure(s): CESAREAN SECTION (N/A) BILATERAL TUBAL LIGATION (Bilateral)  Patient Location: PACU  Anesthesia Type:Spinal  Level of Consciousness: awake, alert  and oriented  Airway & Oxygen Therapy: Patient Spontanous Breathing  Post-op Assessment: Report given to PACU RN and Post -op Vital signs reviewed and stable  Post vital signs: Reviewed and stable  Complications: No apparent anesthesia complications

## 2012-05-20 NOTE — Anesthesia Postprocedure Evaluation (Signed)
Anesthesia Post Note  Patient: Denise Cochran  Procedure(s) Performed: Procedure(s) (LRB): DILATATION AND EVACUATION (N/A)  Anesthesia type: MAC  Patient location: PACU  Post pain: Pain level controlled  Post assessment: Post-op Vital signs reviewed  Last Vitals:  Filed Vitals:   05/20/12 1645  BP:   Pulse: 89  Temp:   Resp: 24    Post vital signs: Reviewed  Level of consciousness: sedated  Complications: No apparent anesthesia complications

## 2012-05-20 NOTE — Anesthesia Postprocedure Evaluation (Signed)
Anesthesia Post Note  Patient: Denise Cochran  Procedure(s) Performed: Procedure(s) (LRB): CESAREAN SECTION (N/A) BILATERAL TUBAL LIGATION (Bilateral)  Anesthesia type: Spinal  Patient location: PACU  Post pain: Pain level controlled  Post assessment: Post-op Vital signs reviewed  Last Vitals:  Filed Vitals:   05/20/12 1345  BP:   Pulse:   Temp: 36.7 C    Post vital signs: Reviewed  Level of consciousness: awake  Complications: No apparent anesthesia complications

## 2012-05-20 NOTE — Anesthesia Preprocedure Evaluation (Signed)
Anesthesia Evaluation  Patient identified by MRN, date of birth, ID band Patient awake    Reviewed: Allergy & Precautions, H&P , NPO status , Patient's Chart, lab work & pertinent test results  Airway Mallampati: III      Dental no notable dental hx.    Pulmonary neg pulmonary ROS,  breath sounds clear to auscultation  Pulmonary exam normal       Cardiovascular Exercise Tolerance: Good negative cardio ROS  Rhythm:regular Rate:Normal     Neuro/Psych negative neurological ROS  negative psych ROS   GI/Hepatic negative GI ROS, Neg liver ROS, GERD-  ,  Endo/Other  negative endocrine ROSMorbid obesity  Renal/GU negative Renal ROS  negative genitourinary   Musculoskeletal   Abdominal Normal abdominal exam  (+)   Peds  Hematology negative hematology ROS (+) anemia ,   Anesthesia Other Findings   Reproductive/Obstetrics (+) Pregnancy                           Anesthesia Physical Anesthesia Plan  ASA: III  Anesthesia Plan: Spinal   Post-op Pain Management:    Induction:   Airway Management Planned:   Additional Equipment:   Intra-op Plan:   Post-operative Plan:   Informed Consent: I have reviewed the patients History and Physical, chart, labs and discussed the procedure including the risks, benefits and alternatives for the proposed anesthesia with the patient or authorized representative who has indicated his/her understanding and acceptance.     Plan Discussed with: Anesthesiologist, CRNA and Surgeon  Anesthesia Plan Comments:         Anesthesia Quick Evaluation

## 2012-05-20 NOTE — Op Note (Addendum)
Patient Name: Denise Cochran MRN: [id number]  Date of Surgery: 05/20/2012    PREOPERATIVE DIAGNOSIS: previous cesarean section, voluntary sterilization, twins [id number]  POSTOPERATIVE DIAGNOSIS: previous cesarean section, voluntary sterilization, twins  PROCEDURE: Low transverse cesarean section, Tubal occlusion with Filshie clips.  SURGEON: Laramie Meissner D. Arlyce Dice M.D.  ASSISTANT: Luvenia Redden, M.D.  ANESTHESIA: Spinal  ESTIMATED BLOOD LOSS: 800 ml  FINDINGS: Twin A: Female, Apgars 6,8; BW 7 lbs 6 oz. Twin B: Female,  Apgars: 8,9; BW 7 lbs 3 oz, clear fluid. Normal uterus and adnexa.   INDICATIONS: Mono chorionic, diamniotic twin gestation at 66 weeks.  Previous cesarean x 2.  Voluntary sterilization.  PROCEDURE IN DETAIL: The patient was taken to the operating room and spinal anesthesia was placed.  She was then placed in the supine position with left lateral displacement of the uterus. The abdomen was prepped and draped in a sterile fashion and the bladder was catheterized.  A low transverse abdominal incision was made and carried down to the fascia. The fascia was opened transversely and the rectus sheath was dissected from the underlying rectus muscle. The rectus midline was identified and opened by sharp and blunt dissection. The peritoneum was opened. An Alexis retractor was placed and the lower uterine segment was identified, entered transversely by careful sharp dissection, and extended bluntly.  Twin A was delivered by Breech extraction.  Twin B was also delivered by Breech extraction.  The placenta was delivered with uterine message and cord traction and the uterus was bluntly curettaged. The lower segment was closed with running interlocking Vicryl 1 suture. The fallopian tubes were grasped with Babcock clamps and bilaterally occluded with Filshie clips.  The peritoneum and rectus muscle were closed in the midline with running 3-0 Vicryl suture. The fascia was closed with running 0 Vicryl  suture and the skin was closed with staples. All sponge and instrument counts were correct.  The patient tolerated the procedure well and left the operating room in good condition.

## 2012-05-20 NOTE — Anesthesia Preprocedure Evaluation (Signed)
Anesthesia Evaluation  Patient identified by MRN, date of birth, ID band Patient awake    Reviewed: Allergy & Precautions, H&P , NPO status , Patient's Chart, lab work & pertinent test results  Airway Mallampati: III      Dental no notable dental hx.    Pulmonary neg pulmonary ROS,  breath sounds clear to auscultation  Pulmonary exam normal       Cardiovascular Exercise Tolerance: Good negative cardio ROS  Rhythm:regular Rate:Normal     Neuro/Psych negative neurological ROS  negative psych ROS   GI/Hepatic Neg liver ROS, GERD-  ,  Endo/Other  Morbid obesity  Renal/GU negative Renal ROS  negative genitourinary   Musculoskeletal   Abdominal Normal abdominal exam  (+)   Peds  Hematology negative hematology ROS (+)   Anesthesia Other Findings   Reproductive/Obstetrics (+) Pregnancy                           Anesthesia Physical  Anesthesia Plan  ASA: III and emergent  Anesthesia Plan: Spinal and MAC   Post-op Pain Management:    Induction:   Airway Management Planned:   Additional Equipment:   Intra-op Plan:   Post-operative Plan:   Informed Consent: I have reviewed the patients History and Physical, chart, labs and discussed the procedure including the risks, benefits and alternatives for the proposed anesthesia with the patient or authorized representative who has indicated his/her understanding and acceptance.     Plan Discussed with: CRNA and Surgeon  Anesthesia Plan Comments: (1. Pt is bleeding vaginally post C-Section for twins. She also may have had a seizure related to Methergine administration but is stable right now and not post ictal.)        Anesthesia Quick Evaluation

## 2012-05-20 NOTE — Op Note (Signed)
Patient Name: TEONIA YAGER MRN: [id number]  Date of Surgery: 05/20/2012    PREOPERATIVE DIAGNOSIS: Post-Partum Hemorrhage  POSTOPERATIVE DIAGNOSIS: Post-Partum Hemorrhage   PROCEDURE: D&E, Placement of Bakri Balloon.  SURGEON: Demon Volante D. Arlyce Dice M.D.  ANESTHESIA: None  ESTIMATED BLOOD LOSS: 300 ml  TRANSFUSION:  1 unit of cross matched PRBC was started in the OR.  A second unit will be transfused post op.  FINDINGS: Blood and clots in uterus.  Lower segment appears intact on instrumentation   INDICATIONS: Persistent PP hemorrhage not responsive to Cytotec or Methergine.  PROCEDURE IN DETAIL: The patient was taken to the OR and placed in the dors-lithotomy position. The perineum and vagina were prepped and draped in a sterile fashion.  A speculum was placed and the anterior cervical lip was identified and grasped with a single tooth tenaculum.  A 12 mm suction currette was carefully introduced and suction curretage was carried out with the removal of 300 - 400 ml of blood and clot.  At no time did I feel any defect in the site of the low transverse uterine incision.  A Bakri Balloon was introduced and filled with 360 ml of saline.  Bleeding was minimal after the Balloon was inflated.  The procedure was then terminated and the patient left the operating room in good condition.

## 2012-05-20 NOTE — Progress Notes (Signed)
I have interviewed and performed the pertinent exams on my patient to confirm that there have been no significant changes in her condition since the dictation of her history and physical exam.  

## 2012-05-20 NOTE — Preoperative (Signed)
Beta Blockers   Reason not to administer Beta Blockers:Not Applicable 

## 2012-05-20 NOTE — Progress Notes (Signed)
Patient had increased vaginal bleeding post op.  She was given cytotec 800 mcg per rectum and when this failed to stop the bleeding she was given 0.2 mg methergine IM.  Shortly after the methergine she appeared to suffer a seizure like episode from which she recovered quickly with no neurologic findings.  On exam large clots were felt in the uterus.  I will bring her back to the OR to evacuate the clots and place a Bacri Balloon.

## 2012-05-20 NOTE — Anesthesia Procedure Notes (Signed)
Spinal  Patient location during procedure: OR Start time: 05/20/2012 12:40 PM End time: 05/20/2012 12:44 PM Staffing Anesthesiologist: Sandrea Hughs Performed by: anesthesiologist  Preanesthetic Checklist Completed: patient identified, site marked, surgical consent, pre-op evaluation, timeout performed, IV checked, risks and benefits discussed and monitors and equipment checked Spinal Block Patient position: sitting Prep: DuraPrep Patient monitoring: heart rate, cardiac monitor, continuous pulse ox and blood pressure Approach: midline Location: L3-4 Injection technique: single-shot Needle Needle type: Sprotte  Needle gauge: 24 G Needle length: 9 cm Needle insertion depth: 8 cm Assessment Sensory level: T4

## 2012-05-20 NOTE — Transfer of Care (Signed)
Immediate Anesthesia Transfer of Care Note  Patient: Denise Cochran  Procedure(s) Performed: Procedure(s): DILATATION AND EVACUATION (N/A)  Patient Location: PACU  Anesthesia Type:Spinal  Level of Consciousness: awake  Airway & Oxygen Therapy: Patient Spontanous Breathing  Post-op Assessment: Report given to PACU RN  Post vital signs: Reviewed and stable  Complications: No apparent anesthesia complications

## 2012-05-21 ENCOUNTER — Encounter (HOSPITAL_COMMUNITY): Payer: Self-pay | Admitting: Obstetrics & Gynecology

## 2012-05-21 LAB — CBC
HCT: 19.6 % — ABNORMAL LOW (ref 36.0–46.0)
Platelets: 154 10*3/uL (ref 150–400)
RDW: 19 % — ABNORMAL HIGH (ref 11.5–15.5)
WBC: 17.8 10*3/uL — ABNORMAL HIGH (ref 4.0–10.5)

## 2012-05-21 MED ORDER — SODIUM CHLORIDE 0.9 % IJ SOLN: 3.0000 mL | INTRAMUSCULAR | Status: DC | PRN

## 2012-05-21 MED ORDER — SODIUM CHLORIDE 0.9 % IJ SOLN
3.0000 mL | Freq: Two times a day (BID) | INTRAMUSCULAR | Status: DC
  Administered 2012-05-21 – 2012-05-22 (×2): 3 mL via INTRAVENOUS

## 2012-05-21 NOTE — Progress Notes (Signed)
Bakri irrigated easily  w/15ccNSS; 150cc (of 360cc) fluid withdrawn from Bakri balloon over 15 mins per MD order

## 2012-05-21 NOTE — Progress Notes (Addendum)
Pt transferring ambulatory to RM #[id number]. Report updated to Armida Sans, Charity fundraiser

## 2012-05-21 NOTE — Anesthesia Postprocedure Evaluation (Signed)
  Anesthesia Post-op Note  Patient: Denise Cochran  Procedure(s) Performed: Procedure(s): CESAREAN SECTION (N/A) BILATERAL TUBAL LIGATION (Bilateral)  Patient Location: AICU  Anesthesia Type:Spinal  Level of Consciousness: awake, alert , oriented and patient cooperative  Airway and Oxygen Therapy: Patient Spontanous Breathing  Post-op Pain: mild  Post-op Assessment: Patient's Cardiovascular Status Stable, Respiratory Function Stable, Adequate PO intake, Pain level controlled, No headache, No residual numbness and No residual motor weakness  Post-op Vital Signs: stable  Complications: No apparent anesthesia complications

## 2012-05-21 NOTE — Progress Notes (Signed)
UR chart review completed.  

## 2012-05-21 NOTE — Progress Notes (Signed)
[facility] charge RN notified of possible pt transfer around 11:30

## 2012-05-21 NOTE — Progress Notes (Signed)
Post Op Day 1 Subjective: no complaints  Objective: Blood pressure 119/84, pulse 90, temperature 98 F (36.7 C), temperature source Oral, resp. rate 18, height 5\' 5"  (1.651 m), weight 231 lb (104.781 kg), last menstrual period 08/28/2011, SpO2 98.00%,  breastfeeding.  Physical Exam:  General: alert Lochia: appropriate Uterine Fundus: firm Incision: healing well   Recent Labs  05/20/12 2235 05/21/12 0545  HGB 8.6* 7.0*  HCT 24.4* 19.6*    Assessment/Plan: No signifiant bleeding since D&E and placement of Bakri Balloon.  150 ml removed from balloon 2 hours ago.  The rest of the saline and the balloon was removed now.  Will observe for 2 hours and transfer to post partum if she continues to be stable.   LOS: 1 day   Mikeala Girdler D 05/21/2012, 9:28 AM

## 2012-05-22 LAB — TYPE AND SCREEN: Unit division: 0

## 2012-05-22 NOTE — Lactation Note (Signed)
This note was copied from the chart of GirlB Karlene Einstein. Lactation Consultation Note  Patient Name: Denise Cochran [id number] Date: 05/22/2012 Reason for consult: Follow-up assessment   Maternal Data    Feeding Feeding Type: Breast Milk Feeding method: Breast Length of feed: 20 min  LATCH Score/Interventions                      Lactation Tools Discussed/Used     Consult Status Consult Status: PRN  Experienced BF mom reports that baby B just finished nursing for 20 minutes. Reports that babies are latching on better with less pain. She had small blister but that has gone away. No questions at present, To call prn.  Pamelia Hoit 05/22/2012, 2:10 PM

## 2012-05-23 MED ORDER — OXYCODONE-ACETAMINOPHEN 5-325 MG PO TABS: 1.0000 | ORAL_TABLET | ORAL | Status: DC | PRN

## 2012-05-23 NOTE — Discharge Summary (Signed)
Obstetric Discharge Summary Reason for Admission: cesarean section Prenatal Procedures: ultrasound Intrapartum Procedures: cesarean: low cervical, transverse and tubal ligation Postpartum Procedures: Return to OR for suction D&E to remove uterine clots and placement of Bakri Balloon.  Transfused 2 units PRBCs. Complications-Operative and Postpartum: PP hemorrhage Hemoglobin  Date Value Range Status  05/21/2012 7.0* 12.0 - 15.0 g/dL Final     REPEATED TO VERIFY     DELTA CHECK NOTED     HCT  Date Value Range Status  05/21/2012 19.6* 36.0 - 46.0 % Final    Physical Exam:  General: alert Lochia: appropriate Uterine Fundus: firm Incision: healing well DVT Evaluation: No evidence of DVT seen on physical exam.  Discharge Diagnoses: Monoamniotic, dichorionic twin gestation.  Voluntary sterilization.  Post partum hemorrhage due to uterine atony.  Discharge Information: Date: 05/23/2012 Activity: Limited Diet: routine Medications: PNV, Ibuprofen, Iron and Percocet Condition: stable Instructions: refer to practice specific booklet Discharge to: home Follow-up Information   Follow up with Mickel Baas, MD. Schedule an appointment as soon as possible for a visit in 4 weeks.   Contact information:   [address] [place] [address] [phone]       Newborn Data:   Myrah, Strawderman [[id number]]  Live born female  Birth Weight: 7 lb 6.5 oz (3360 g) APGAR: 6, 8   Sakiyah, Shur [[id number]]  Live born female  Birth Weight: 7 lb 2.6 oz (3250 g) APGAR: 8, 9  Home with mother.  Irianna Gilday D 05/23/2012, 9:30 AM

## 2013-11-09 ENCOUNTER — Other Ambulatory Visit: Payer: Self-pay | Admitting: Obstetrics and Gynecology

## 2013-11-10 LAB — CYTOLOGY - PAP

## 2013-11-14 ENCOUNTER — Encounter (HOSPITAL_COMMUNITY): Payer: Self-pay | Admitting: Obstetrics & Gynecology

## 2014-02-10 ENCOUNTER — Inpatient Hospital Stay (HOSPITAL_COMMUNITY)
Admission: EM | Admit: 2014-02-10 | Discharge: 2014-02-11 | DRG: 312 | Disposition: A | Discharge status: Home / Self Care | Payer: BLUE CROSS/BLUE SHIELD | Attending: Family Medicine | Admitting: Family Medicine

## 2014-02-10 ENCOUNTER — Other Ambulatory Visit: Payer: Self-pay | Admitting: Obstetrics & Gynecology

## 2014-02-10 ENCOUNTER — Encounter (HOSPITAL_COMMUNITY): Payer: Self-pay | Admitting: *Deleted

## 2014-02-10 DIAGNOSIS — I4581 Long QT syndrome: Secondary | ICD-10-CM | POA: Diagnosis present

## 2014-02-10 DIAGNOSIS — K219 Gastro-esophageal reflux disease without esophagitis: Secondary | ICD-10-CM | POA: Diagnosis present

## 2014-02-10 DIAGNOSIS — Z888 Allergy status to other drugs, medicaments and biological substances status: Secondary | ICD-10-CM | POA: Diagnosis not present

## 2014-02-10 DIAGNOSIS — R55 Syncope and collapse: Secondary | ICD-10-CM | POA: Diagnosis present

## 2014-02-10 DIAGNOSIS — I959 Hypotension, unspecified: Secondary | ICD-10-CM | POA: Diagnosis present

## 2014-02-10 DIAGNOSIS — R9431 Abnormal electrocardiogram [ECG] [EKG]: Secondary | ICD-10-CM | POA: Diagnosis present

## 2014-02-10 LAB — TSH: TSH: 0.454 u[IU]/mL (ref 0.350–4.500)

## 2014-02-10 LAB — BASIC METABOLIC PANEL
Anion gap: 9 (ref 5–15)
BUN: 12 mg/dL (ref 6–23)
CO2: 25 mmol/L (ref 19–32)
CREATININE: 0.84 mg/dL (ref 0.50–1.10)
Calcium: 9.4 mg/dL (ref 8.4–10.5)
Chloride: 105 mmol/L (ref 96–112)
GFR calc Af Amer: >90 mL/min (ref >90)
GFR, EST NON AFRICAN AMERICAN: 90 mL/min — AB (ref >90)
GLUCOSE: 88 mg/dL (ref 70–99)
POTASSIUM: 3.6 mmol/L (ref 3.5–5.1)
SODIUM: 139 mmol/L (ref 135–145)

## 2014-02-10 LAB — CBC WITH DIFFERENTIAL/PLATELET
Basophils Absolute: 0 10*3/uL (ref 0.0–0.1)
Basophils Relative: 0 % (ref 0–1)
EOS ABS: 0 10*3/uL (ref 0.0–0.7)
Eosinophils Relative: 0 % (ref 0–5)
HEMATOCRIT: 34.3 % — AB (ref 36.0–46.0)
HEMOGLOBIN: 12.1 g/dL (ref 12.0–15.0)
LYMPHS PCT: 16 % (ref 12–46)
Lymphs Abs: 1.5 10*3/uL (ref 0.7–4.0)
MCH: 27 pg (ref 26.0–34.0)
MCHC: 35.3 g/dL (ref 30.0–36.0)
MCV: 76.6 fL — ABNORMAL LOW (ref 78.0–100.0)
MONO ABS: 0.8 10*3/uL (ref 0.1–1.0)
MONOS PCT: 8 % (ref 3–12)
NEUTROS ABS: 7.4 10*3/uL (ref 1.7–7.7)
NEUTROS PCT: 76 % (ref 43–77)
Platelets: 301 10*3/uL (ref 150–400)
RBC: 4.48 MIL/uL (ref 3.87–5.11)
RDW: 14.1 % (ref 11.5–15.5)
WBC: 9.8 10*3/uL (ref 4.0–10.5)

## 2014-02-10 LAB — I-STAT CHEM 8, ED
BUN: 10 mg/dL (ref 6–23)
Calcium, Ion: 1.2 mmol/L (ref 1.12–1.23)
Chloride: 102 mmol/L (ref 96–112)
Creatinine, Ser: 0.8 mg/dL (ref 0.50–1.10)
Glucose, Bld: 94 mg/dL (ref 70–99)
HCT: 39 % (ref 36.0–46.0)
HEMOGLOBIN: 13.3 g/dL (ref 12.0–15.0)
Potassium: 3.5 mmol/L (ref 3.5–5.1)
Sodium: 139 mmol/L (ref 135–145)
TCO2: 23 mmol/L (ref 0–100)

## 2014-02-10 LAB — POC URINE PREG, ED: PREG TEST UR: NEGATIVE

## 2014-02-10 LAB — MAGNESIUM: MAGNESIUM: 2 mg/dL (ref 1.5–2.5)

## 2014-02-10 LAB — TROPONIN I: Troponin I: <0.03 ng/mL (ref <0.031)

## 2014-02-10 MED ORDER — ONDANSETRON HCL 4 MG PO TABS: 4.0000 mg | ORAL_TABLET | Freq: Four times a day (QID) | ORAL | Status: DC | PRN

## 2014-02-10 MED ORDER — ACETAMINOPHEN 325 MG PO TABS
650.0000 mg | ORAL_TABLET | Freq: Four times a day (QID) | ORAL | Status: DC | PRN
Start: 2014-02-10 — End: 2014-02-11

## 2014-02-10 MED ORDER — SODIUM CHLORIDE 0.9 % IV SOLN
INTRAVENOUS | Status: DC
  Administered 2014-02-10: 19:00:00 via INTRAVENOUS

## 2014-02-10 MED ORDER — ACETAMINOPHEN 650 MG RE SUPP
650.0000 mg | Freq: Four times a day (QID) | RECTAL | Status: DC | PRN
Start: 2014-02-10 — End: 2014-02-11

## 2014-02-10 MED ORDER — ONDANSETRON HCL 4 MG/2ML IJ SOLN: 4.0000 mg | Freq: Four times a day (QID) | INTRAMUSCULAR | Status: DC | PRN

## 2014-02-10 MED ORDER — OXYCODONE-ACETAMINOPHEN 5-325 MG PO TABS: 1.0000 | ORAL_TABLET | ORAL | Status: DC | PRN

## 2014-02-10 MED ORDER — ENOXAPARIN SODIUM 40 MG/0.4ML ~~LOC~~ SOLN
40.0000 mg | SUBCUTANEOUS | Status: DC
  Administered 2014-02-10: 40 mg via SUBCUTANEOUS
  Filled 2014-02-10: qty 0.4

## 2014-02-10 NOTE — ED Provider Notes (Signed)
CSN: [id number]     Arrival date & time 02/10/14  1207 History   First MD Initiated Contact with Patient 02/10/14 1345     Chief Complaint  Patient presents with  . Near Syncope     (Consider location/radiation/quality/duration/timing/severity/associated sxs/prior Treatment) HPI   Denise Cochran is a 34 y.o. female complaining of syncopal event while at her OB/GYN's office. Pt states she had syncope x2 approx apart while waiting to checkout. The first episode came on while sitting at rest. She felt dizzy, warm, sweaty, and like her heart was beating out of her chest and had a witnessed syncopal episode of unk length. Pt states approx later she was attempting to checkout and had another syncopal episode with same prodrome while waiting in line. Pt states bystanders told her she fell to a carpeted floor striking her head. Pt denies any h/a, neck pain, or current anti-coagulant therapy. She states she has Hx of similar episodes with similar prodromes that have been worked up by Cardiology with no Dx. She denies any current dizziness, h/a, blurred vision, nausea, CP/SOB/palpitations, weakness, or paresthesias.   Past Medical History  Diagnosis Date  . Anemia   . Heartburn in pregnancy    Past Surgical History  Procedure Laterality Date  . Cesarean section    . Cesarean section N/A 05/20/2012    Procedure: CESAREAN SECTION;  Surgeon: Mickel Baas, MD;  Location: [facility];  Service: Obstetrics;  Laterality: N/A;  . Tubal ligation Bilateral 05/20/2012    Procedure: BILATERAL TUBAL LIGATION;  Surgeon: Mickel Baas, MD;  Location: [facility];  Service: Obstetrics;  Laterality: Bilateral;  . Dilation and evacuation N/A 05/20/2012    Procedure: DILATATION AND EVACUATION;  Surgeon: Mickel Baas, MD;  Location: [facility];  Service: Gynecology;  Laterality: N/A;   No family history on file. History  Substance Use Topics  . Smoking status: Never Smoker   . Smokeless tobacco: Not on file   . Alcohol Use: No   OB History    Gravida Para Term Preterm AB TAB SAB Ectopic Multiple Living   Review of Systems  10 systems reviewed and found to be negative, except as noted in the HPI.   Allergies  Methergine  Home Medications   Prior to Admission medications   Medication Sig Start Date End Date Taking? Authorizing Provider  amoxicillin (AMOXIL) 500 MG capsule Take 500 mg by mouth 3 (three) times daily. She was to take for 10 days. Her prescription was filled on 02/01/14. She has not completed the regimen. 02/01/14  Yes Historical Provider, MD  fluconazole (DIFLUCAN) 150 MG tablet Take 150 mg by mouth once.  02/07/14  Yes Historical Provider, MD  ibuprofen (ADVIL,MOTRIN) 800 MG tablet Take 800 mg by mouth 3 (three) times daily as needed (For pain.).  01/27/14  Yes Historical Provider, MD  miconazole (MONISTAT 7) 2 % vaginal cream Place 1 Applicatorful vaginally at bedtime.   Yes Historical Provider, MD  oxyCODONE-acetaminophen (PERCOCET/ROXICET) 5-325 MG per tablet Take 1-2 tablets by mouth every 4 (four) hours as needed. Patient not taking: Reported on 02/10/2014 05/23/12   Mickel Baas, MD   BP 146/78 mmHg  Pulse 97  Temp(Src) 98 F (36.7 C) (Oral)  Resp 17  SpO2 100%  LMP 01/16/2014 Physical Exam  Constitutional: She is oriented to person, place, and time. She appears well-developed and well-nourished. No distress.  HENT:  Head: Normocephalic and atraumatic.  Mouth/Throat: Oropharynx is clear and moist.  Eyes: Conjunctivae and EOM are normal. Pupils are equal, round, and reactive to light.  Neck: Normal range of motion.  Cardiovascular: Normal rate, regular rhythm and intact distal pulses.   Pulmonary/Chest: Effort normal and breath sounds normal. No stridor. No respiratory distress. She has no wheezes. She has no rales. She exhibits no tenderness.  Abdominal: Soft. Bowel sounds are normal. She exhibits no distension and no mass. There is no  tenderness. There is no rebound and no guarding.  Musculoskeletal: Normal range of motion. She exhibits no edema or tenderness.  No calf asymmetry, superficial collaterals, palpable cords, edema, Homans sign negative bilaterally.    Neurological: She is alert and oriented to person, place, and time.  Psychiatric: She has a normal mood and affect.  Nursing note and vitals reviewed.   ED Course  Procedures (including critical care time) Labs Review Labs Reviewed  CBC WITH DIFFERENTIAL/PLATELET - Abnormal; Notable for the following:    HCT 34.3 (*)    MCV 76.6 (*)    All other components within normal limits  BASIC METABOLIC PANEL - Abnormal; Notable for the following:    GFR calc non Af Amer 90 (*)    All other components within normal limits  TROPONIN I  I-STAT CHEM 8, ED  POC URINE PREG, ED    Imaging Review No results found.   EKG Interpretation   Date/Time:  Friday February 10 2014 12:22:23 EST Ventricular Rate:  79 PR Interval:  143 QRS Duration: 103 QT Interval:  495 QTC Calculation: 567 R Axis:   30 Text Interpretation:  Sinus rhythm RSR' in V1 or V2, right VCD or RVH  Prolonged QT interval Baseline wander in lead(s) V6 Prolonged QT, no prior  for comparison Confirmed by [facility]AL[facility]  MD, BLAIR ([id number]) on 02/10/2014 3:54:51  PM      MDM   Final diagnoses:  Syncope and collapse  Prolonged Q-T interval on ECG   Filed Vitals:   02/10/14 1213 02/10/14 1522  BP: 140/105 146/78  Pulse: 88 97  Temp: 97.9 F (36.6 C) 98 F (36.7 C)  TempSrc: Oral Oral  Resp: 14 17  SpO2: 100% 100%    Denise Cochran is a pleasant 34 y.o. female presenting with multipleepisodes of syncope. EKG shows normal sinus rhythm with significantly prolonged QTC. Case discussed with Trish from cardiology and they will consult, will be admitted to hospitalist Dr. Glenford BayleyHonalgi     Denise Felter, PA-C 02/10/14 1656  Elwin MochaBlair Walden, MD 02/11/14 [phone]

## 2014-02-10 NOTE — Progress Notes (Signed)
  CARE MANAGEMENT ED NOTE 02/10/2014  Patient:  Denise Cochran,Denise Cochran   Account Number:  [id number]  Date Initiated:  02/10/2014  Documentation initiated by:  Edd ArbourGIBBS,Janson Lamar  Subjective/Objective Assessment:   34 yr old bcbs other [place] pt from OB/GYN office, had syncopal episode while having blood drawn, recovered then had another episode while waiting in check out line. Office staff believed she hit her head on carp     Subjective/Objective Assessment Detail:       [facility] [place] and ob gyn is [facility] [place]     Action/Plan:   EPIC updated   Action/Plan Detail:   Anticipated DC Date:       Status Recommendation to Physician:   Result of Recommendation:    Other ED Services  Consult Working Psychologist, educationallan    DC Planning Services  Other  Outpatient Services - Pt will follow up  PCP issues    Choice offered to / List presented to:            Status of service:  Completed, signed off  ED Comments:   ED Comments Detail:

## 2014-02-10 NOTE — ED Notes (Addendum)
Pt in from OB/GYN office, had syncopal episode while having blood drawn, recovered then had another episode while waiting in check out line. Office staff believed she hit her head on carpeted floor. Pt denies pregnancy. EMS cbg 114, BP 162/110, p86.

## 2014-02-10 NOTE — Consult Note (Signed)
CONSULTATION NOTE  Reason for Consult: Syncope, prolonged QTc  Requesting Physician: Dr. Darrick Meigs  Cardiologist: None (New)  HPI: This is a 34 y.o. female with a past medical history significant for anemia and GERD which was associated with her pregnancy. She presents today from follow-up from her OB/GYN's office. She was there for follow-up after a recent strep throat for which she was prescribed amoxicillin. Subsequently she developed a yeast infection was given Diflucan on 02/07/2014. She took this without difficulty and has had resolution of her yeast infection. She's taken Diflucan before without any difficulty. Today in the office she had blood work and a gynecologic exam. Shortly after having blood drawn she got up to go check out and she felt hot and somewhat sweaty as well as nauseated she started to feel dizzy and then passed out. She was surrounded by nurses on the floor when she came to and was helped to her feet. Shortly after that she felt like she would fall out again and then quickly passed out. Subsequently she was brought to the hospital for further evaluation. She denied any tachycardia palpitations or chest pain prior to these events. She does have a history of palpitations and has been monitored in the past many years ago but this failed to show any significant arrhythmia. There is a recent family history of sudden death in her older brother who died at age 81 of yet unknown causes. He has no history of coronary disease or congenital heart disease that we know of. EKG today shows low voltage T waves and borderline long QT interval. I'm not sure by my measurements that it is truly 567 ms. She does have an RSR in V1 and V2 suggesting some RV conduction delay or possible right ventricular hypertrophy. The echocardiogram should shed some light on this. I do not feel there is clear evidence for preexcitation and the septal pattern is not classic for Brugada syndrome.   PMHx:  Past  Medical History  Diagnosis Date  . Anemia   . Heartburn in pregnancy    Past Surgical History  Procedure Laterality Date  . Cesarean section    . Cesarean section N/A 05/20/2012    Procedure: CESAREAN SECTION;  Surgeon: Sharene Butters, MD;  Location: [facility];  Service: Obstetrics;  Laterality: N/A;  . Tubal ligation Bilateral 05/20/2012    Procedure: BILATERAL TUBAL LIGATION;  Surgeon: Sharene Butters, MD;  Location: [facility];  Service: Obstetrics;  Laterality: Bilateral;  . Dilation and evacuation N/A 05/20/2012    Procedure: DILATATION AND EVACUATION;  Surgeon: Sharene Butters, MD;  Location: [facility];  Service: Gynecology;  Laterality: N/A;    FAMHx: Family History  Problem Relation Age of Onset  . Sudden death Brother 40    SOCHx:  reports that she has never smoked. She does not have any smokeless tobacco history on file. She reports that she does not drink alcohol or use illicit drugs.  ALLERGIES: Allergies  Allergen Reactions  . Methergine [Methylergonovine Maleate]     Seizure activity    ROS: A comprehensive review of systems was negative except for: Cardiovascular: positive for syncope  HOME MEDICATIONS: Prescriptions prior to admission  Medication Sig Dispense Refill Last Dose  . amoxicillin (AMOXIL) 500 MG capsule Take 500 mg by mouth 3 (three) times daily. She was to take for 10 days. Her prescription was filled on 02/01/14. She has not completed the regimen.   02/04/2014  . fluconazole (DIFLUCAN) 150 MG tablet  Take 150 mg by mouth once.    02/07/2014  . ibuprofen (ADVIL,MOTRIN) 800 MG tablet Take 800 mg by mouth 3 (three) times daily as needed (For pain.).    2 months or more  . miconazole (MONISTAT 7) 2 % vaginal cream Place 1 Applicatorful vaginally at bedtime.   02/09/2014  . oxyCODONE-acetaminophen (PERCOCET/ROXICET) 5-325 MG per tablet Take 1-2 tablets by mouth every 4 (four) hours as needed. (Patient not taking: Reported on 02/10/2014) 20 tablet 0 Completed Course at  Unknown time    HOSPITAL MEDICATIONS: Prior to Admission:  Prescriptions prior to admission  Medication Sig Dispense Refill Last Dose  . amoxicillin (AMOXIL) 500 MG capsule Take 500 mg by mouth 3 (three) times daily. She was to take for 10 days. Her prescription was filled on 02/01/14. She has not completed the regimen.   02/04/2014  . fluconazole (DIFLUCAN) 150 MG tablet Take 150 mg by mouth once.    02/07/2014  . ibuprofen (ADVIL,MOTRIN) 800 MG tablet Take 800 mg by mouth 3 (three) times daily as needed (For pain.).    2 months or more  . miconazole (MONISTAT 7) 2 % vaginal cream Place 1 Applicatorful vaginally at bedtime.   02/09/2014  . oxyCODONE-acetaminophen (PERCOCET/ROXICET) 5-325 MG per tablet Take 1-2 tablets by mouth every 4 (four) hours as needed. (Patient not taking: Reported on 02/10/2014) 20 tablet 0 Completed Course at Unknown time    VITALS: Blood pressure 144/86, pulse 81, temperature 98.5 F (36.9 C), temperature source Oral, resp. rate 18, height 5' 6"  (1.676 m), weight 196 lb 3.4 oz (89 kg), last menstrual period 01/16/2014, SpO2 100 %, unknown if currently breastfeeding.  PHYSICAL EXAM: General appearance: alert and no distress Neck: no carotid bruit and no JVD Lungs: clear to auscultation bilaterally Heart: regular rate and rhythm, S1, S2 normal, no murmur, click, rub or gallop Abdomen: soft, non-tender; bowel sounds normal; no masses,  no organomegaly Extremities: extremities normal, atraumatic, no cyanosis or edema Pulses: 2+ and symmetric Skin: Skin color, texture, turgor normal. No rashes or lesions Neurologic: Grossly normal Psych: Normal mood, affect  LABS: Results for orders placed or performed during the hospital encounter of 02/10/14 (from the past 48 hour(s))  I-Stat Chem 8, ED     Status: None   Collection Time: 02/10/14 12:57 PM  Result Value Ref Range   Sodium 139 135 - 145 mmol/L   Potassium 3.5 3.5 - 5.1 mmol/L   Chloride 102 96 - 112 mmol/L    BUN 10 6 - 23 mg/dL   Creatinine, Ser 0.80 0.50 - 1.10 mg/dL   Glucose, Bld 94 70 - 99 mg/dL   Calcium, Ion 1.20 1.12 - 1.23 mmol/L   TCO2 23 0 - 100 mmol/L   Hemoglobin 13.3 12.0 - 15.0 g/dL   HCT 39.0 36.0 - 46.0 %  POC Urine Preg, ED (if patient is pre-menopausal female) NOT at [facility]     Status: None   Collection Time: 02/10/14  2:00 PM  Result Value Ref Range   Preg Test, Ur NEGATIVE NEGATIVE    Comment:        THE SENSITIVITY OF THIS METHODOLOGY IS >24 mIU/mL   CBC with Differential     Status: Abnormal   Collection Time: 02/10/14  2:25 PM  Result Value Ref Range   WBC 9.8 4.0 - 10.5 K/uL   RBC 4.48 3.87 - 5.11 MIL/uL   Hemoglobin 12.1 12.0 - 15.0 g/dL   HCT 34.3 (L) 36.0 - 46.0 %  MCV 76.6 (L) 78.0 - 100.0 fL   MCH 27.0 26.0 - 34.0 pg   MCHC 35.3 30.0 - 36.0 g/dL   RDW 14.1 11.5 - 15.5 %   Platelets 301 150 - 400 K/uL   Neutrophils Relative % 76 43 - 77 %   Neutro Abs 7.4 1.7 - 7.7 K/uL   Lymphocytes Relative 16 12 - 46 %   Lymphs Abs 1.5 0.7 - 4.0 K/uL   Monocytes Relative 8 3 - 12 %   Monocytes Absolute 0.8 0.1 - 1.0 K/uL   Eosinophils Relative 0 0 - 5 %   Eosinophils Absolute 0.0 0.0 - 0.7 K/uL   Basophils Relative 0 0 - 1 %   Basophils Absolute 0.0 0.0 - 0.1 K/uL  Basic metabolic panel     Status: Abnormal   Collection Time: 02/10/14  2:25 PM  Result Value Ref Range   Sodium 139 135 - 145 mmol/L   Potassium 3.6 3.5 - 5.1 mmol/L   Chloride 105 96 - 112 mmol/L   CO2 25 19 - 32 mmol/L   Glucose, Bld 88 70 - 99 mg/dL   BUN 12 6 - 23 mg/dL   Creatinine, Ser 0.84 0.50 - 1.10 mg/dL   Calcium 9.4 8.4 - 10.5 mg/dL   GFR calc non Af Amer 90 (L) >90 mL/min   GFR calc Af Amer >90 >90 mL/min    Comment: (NOTE) The eGFR has been calculated using the CKD EPI equation. This calculation has not been validated in all clinical situations. eGFR's persistently <90 mL/min signify possible Chronic Kidney Disease.    Anion gap 9 5 - 15  Troponin I     Status: None    Collection Time: 02/10/14  2:25 PM  Result Value Ref Range   Troponin I <0.03 <0.031 ng/mL    Comment:        NO INDICATION OF MYOCARDIAL INJURY.    EKG: Normal sinus rhythm, RSR in V1 and V2. I measured QT interval less than 500 ms.  IMAGING: No results found.  HOSPITAL DIAGNOSES: Principal Problem:   Prolonged QT interval Active Problems:   Syncope   IMPRESSION: 1. Probable vasovagal syncope 2. Palpitations/tachycardia arrhythmias 3. Questionably prolonged QT interval  RECOMMENDATION: 1. Ms. Denise Cochran is describing what sounds like vasovagal syncope. There is a clear prodrome of symptoms and this episode occurred several minutes after having blood drawn. She's had 2 other syncopal episodes in the past with similar circumstances. She denied any tachycardia palpitations or chest pain prior to the episode and it was not associated with exertion. Her monitor here shows some sinus arrhythmia with periods of tachycardia and occasional PACs or PVCs. There is been no evidence of nonsustained VT. Her EKG shows very flat T waves and I question the assessment of the QTC being as long as it is. One explanation for QT prolongation could be Diflucan, however that medication was administered 3 days ago. There is slight delay in upstroke of the QRS but I did not think it qualifies for preexcitation. I do not see clear evidence of SVT elevation in the septal leads consistent with any type of Brugada syndrome. I agree with an echocardiogram to further evaluate structure of the heart. We are still awaiting additional laboratory work including a magnesium. I would recommend repeating a 12-lead EKG in the morning to further evaluate her QT interval.  We will follow with you over the weekend. Thanks for consulting [place].  Time Spent Directly with Patient: 53  minutes  Pixie Casino, MD, [facility] Attending Cardiologist [facility]  Kamrie Fanton C 02/10/2014, 7:07 PM

## 2014-02-10 NOTE — H&P (Signed)
PCP:   No primary care provider on file.   Chief Complaint:  Denise Cochran out  HPI:  34 year old female who  has a past medical history of Anemia and Heartburn in pregnancy. Today was brought to the ED from OB/GYN office after patient had 2 back-to-back episodes of syncope. The first episode of hypotension when she was sitting at rest patient felt dizzy warm and sweaty and felt the heart was beating out and had a witnessed syncopal episode of unknown duration. 10 minutes later she had as a syncopal episode while waiting in line. Patient  denies any injury, no chest pain or shortness of breath. No blurred vision, no seizure-like activity. She denies nausea vomiting or diarrhea. No fever no dysuria. Patient had similar episodes a few years ago, at that time she had event monitor placed which failed to show any significant arrhythmia.  EKG in the ED showed QTC prolongation. QTc interval 567.    Allergies:   Allergies  Allergen Reactions  . Methergine [Methylergonovine Maleate]     Seizure activity    family history- positive family history of sudden death, her brother died at the age of 106 due to unknown reason   Past Medical History  Diagnosis Date  . Anemia   . Heartburn in pregnancy     Past Surgical History  Procedure Laterality Date  . Cesarean section    . Cesarean section N/A 05/20/2012    Procedure: CESAREAN SECTION;  Surgeon: Mickel Baas, MD;  Location: [facility];  Service: Obstetrics;  Laterality: N/A;  . Tubal ligation Bilateral 05/20/2012    Procedure: BILATERAL TUBAL LIGATION;  Surgeon: Mickel Baas, MD;  Location: [facility];  Service: Obstetrics;  Laterality: Bilateral;  . Dilation and evacuation N/A 05/20/2012    Procedure: DILATATION AND EVACUATION;  Surgeon: Mickel Baas, MD;  Location: [facility];  Service: Gynecology;  Laterality: N/A;    Prior to Admission medications   Medication Sig Start Date End Date Taking? Authorizing Provider  amoxicillin (AMOXIL) 500 MG  capsule Take 500 mg by mouth 3 (three) times daily. She was to take for 10 days. Her prescription was filled on 02/01/14. She has not completed the regimen. 02/01/14  Yes Historical Provider, MD  fluconazole (DIFLUCAN) 150 MG tablet Take 150 mg by mouth once.  02/07/14  Yes Historical Provider, MD  ibuprofen (ADVIL,MOTRIN) 800 MG tablet Take 800 mg by mouth 3 (three) times daily as needed (For pain.).  01/27/14  Yes Historical Provider, MD  miconazole (MONISTAT 7) 2 % vaginal cream Place 1 Applicatorful vaginally at bedtime.   Yes Historical Provider, MD  oxyCODONE-acetaminophen (PERCOCET/ROXICET) 5-325 MG per tablet Take 1-2 tablets by mouth every 4 (four) hours as needed. Patient not taking: Reported on 02/10/2014 05/23/12   Mickel Baas, MD    Social History:  reports that she has never smoked. She does not have any smokeless tobacco history on file. She reports that she does not drink alcohol or use illicit drugs.  No family history on file.   All the positives are listed in BOLD  Review of Systems:  HEENT: Headache, blurred vision, runny nose, sore throat Neck: Hypothyroidism, hyperthyroidism,,lymphadenopathy Chest : Shortness of breath, history of COPD, Asthma Heart : Chest pain, history of coronary arterey disease, palpitations  GI:  Nausea, vomiting, diarrhea, constipation, GERD GU: Dysuria, urgency, frequency of urination, hematuria Neuro: Stroke, seizures, syncope Psych: Depression, anxiety, hallucinations   Physical Exam: Blood pressure 146/78, pulse 97, temperature 98 F (36.7  C), temperature source Oral, resp. rate 17, last menstrual period 01/16/2014, SpO2 100 %, unknown if currently breastfeeding. Constitutional:   Patient is a well-developed and well-nourished female* in no acute distress and cooperative with exam. Head: Normocephalic and atraumatic Mouth: Mucus membranes moist Eyes: PERRL, EOMI, conjunctivae normal Neck: Supple, No Thyromegaly Cardiovascular: RRR, S1  normal, S2 normal Pulmonary/Chest: CTAB, no wheezes, rales, or rhonchi Abdominal: Soft. Non-tender, non-distended, bowel sounds are normal, no masses, organomegaly, or guarding present.  Neurological: A&O x3, Strength is normal and symmetric bilaterally, cranial nerve II-XII are grossly intact, no focal motor deficit, sensory intact to light touch bilaterally.  Extremities : No Cyanosis, Clubbing or Edema  Labs on Admission:  Basic Metabolic Panel:  Recent Labs Lab 02/10/14 1257 02/10/14 1425  NA 139 139  K 3.5 3.6  CL 102 105  CO2  --  25  GLUCOSE 94 88  BUN 10 12  CREATININE 0.80 0.84  CALCIUM  --  9.4   Liver Function Tests: No results for input(s): AST, ALT, ALKPHOS, BILITOT, PROT, ALBUMIN in the last 168 hours. No results for input(s): LIPASE, AMYLASE in the last 168 hours. No results for input(s): AMMONIA in the last 168 hours. CBC:  Recent Labs Lab 02/10/14 1257 02/10/14 1425  WBC  --  9.8  NEUTROABS  --  7.4  HGB 13.3 12.1  HCT 39.0 34.3*  MCV  --  76.6*  PLT  --  301   Cardiac Enzymes:  Recent Labs Lab 02/10/14 1425  TROPONINI <0.03    BNP (last 3 results) No results for input(s): PROBNP in the last 8760 hours. CBG: No results for input(s): GLUCAP in the last 168 hours.  Radiological Exams on Admission: No results found.  EKG: Independently reviewed. QTC prolonged and, Qtc interval  567.   Assessment/Plan Principal Problem:   Prolonged QT interval Active Problems:   Syncope   History of seizure  Syncope  patient has history of syncope and had similar episodes few  years ago, when even monitor failed to show any significant arrhythmia. EKG today shows prolonged QT interval , will check serum magnesium. Cardiology  has been consulted by the ED physician. Will check 2-D echocardiogram, and cycle serial troponin.   History of seizure  Patient had a seizure after she had C-section 2 years ago, will check EEG to rule out underlying seizure  disorder.   DVT prophylaxis Lovenox  Code status:Full code   Family discussion: No family at bedside    Time Spent on Admission: 60 min  [facility]lakeAMA,Masa Lubin S [facility] Pager: [phone] 02/10/2014, 5:16 PM  If 7PM-7AM, please contact night-coverage  [email or web address]  Password [email or web address]

## 2014-02-11 DIAGNOSIS — R55 Syncope and collapse: Secondary | ICD-10-CM

## 2014-02-11 LAB — COMPREHENSIVE METABOLIC PANEL
ALK PHOS: 45 U/L (ref 39–117)
ALT: 11 U/L (ref 0–35)
AST: 13 U/L (ref 0–37)
Albumin: 3.3 g/dL — ABNORMAL LOW (ref 3.5–5.2)
Anion gap: 7 (ref 5–15)
BILIRUBIN TOTAL: 0.7 mg/dL (ref 0.3–1.2)
BUN: 13 mg/dL (ref 6–23)
CALCIUM: 8.8 mg/dL (ref 8.4–10.5)
CO2: 26 mmol/L (ref 19–32)
Chloride: 109 mmol/L (ref 96–112)
Creatinine, Ser: 0.78 mg/dL (ref 0.50–1.10)
GFR calc Af Amer: >90 mL/min (ref >90)
GFR calc non Af Amer: >90 mL/min (ref >90)
Glucose, Bld: 98 mg/dL (ref 70–99)
POTASSIUM: 4.1 mmol/L (ref 3.5–5.1)
Sodium: 142 mmol/L (ref 135–145)
Total Protein: 6.7 g/dL (ref 6.0–8.3)

## 2014-02-11 LAB — TROPONIN I: Troponin I: <0.03 ng/mL (ref <0.031)

## 2014-02-11 LAB — CBC
HEMATOCRIT: 31.3 % — AB (ref 36.0–46.0)
Hemoglobin: 10.9 g/dL — ABNORMAL LOW (ref 12.0–15.0)
MCH: 26.4 pg (ref 26.0–34.0)
MCHC: 34.8 g/dL (ref 30.0–36.0)
MCV: 75.8 fL — AB (ref 78.0–100.0)
Platelets: 288 10*3/uL (ref 150–400)
RBC: 4.13 MIL/uL (ref 3.87–5.11)
RDW: 14.2 % (ref 11.5–15.5)
WBC: 7.4 10*3/uL (ref 4.0–10.5)

## 2014-02-11 NOTE — Progress Notes (Signed)
  Echocardiogram 2D Echocardiogram has been performed.  Niana Martorana A 02/11/2014, 12:35 PM 

## 2014-02-11 NOTE — Progress Notes (Signed)
Subjective:  No complaints overnight  Objective:  Vital Signs in the last 24 hours: BP 115/65 mmHg  Pulse 84  Temp(Src) 98.4 F (36.9 C) (Oral)  Resp 16  Ht 5\' 6"  (1.676 m)  Wt 89 kg (196 lb 3.4 oz)  BMI 31.68 kg/m2  SpO2 100%  LMP 01/16/2014  Physical Exam: Pleasant BF in NAD Lungs:  Clear Cardiac:  Regular rhythm, normal S1 and S2, no S3 resent  Intake/Output from previous day:    Weight Filed Weights   02/10/14 1748  Weight: 89 kg (196 lb 3.4 oz)    Lab Results: Basic Metabolic Panel:  Recent Labs  40/98/1101/29/16 1425 02/11/14 0611  NA 139 142  K 3.6 4.1  CL 105 109  CO2 25 26  GLUCOSE 88 98  BUN 12 13  CREATININE 0.84 0.78   CBC:  Recent Labs  02/10/14 1425 02/11/14 0611  WBC 9.8 7.4  NEUTROABS 7.4  --   HGB 12.1 10.9*  HCT 34.3* 31.3*  MCV 76.6* 75.8*  PLT 301 288   Cardiac Enzymes:  Recent Labs  02/10/14 1757 02/11/14 0023 02/11/14 0611  TROPONINI <0.03 <0.03 <0.03    Telemetry: sinus  Assessment/Plan:  1. Historically this sounds like vasovagal syncope.  Her EKG today shows a QTC of .45.  It does not appear to have a Brugada pattern on it.   Await the results of the ECHO and other testing.     Darden PalmerW. Spencer Micheal Murad, Jr.  MD [facility]frontFACC Cardiology  02/11/2014, 9:08 AM

## 2014-02-11 NOTE — Discharge Summary (Addendum)
Physician Discharge Summary  Denise Cochran ZOX:[id number] DOB: 1980/01/28 DOA: 02/10/2014  PCP: [facility]  Admit date: 02/10/2014 Discharge date: 02/11/2014  Time spent: 25* minutes  Recommendations for Outpatient Follow-up:  1. *Follow up PCP in 2 weeks  Discharge Diagnoses:  Principal Problem: Vasovagal syncope Active Problems:   Syncope   Discharge Condition: Stable  Diet recommendation: Regular diet  Filed Weights   02/10/14 1748  Weight: 89 kg (196 lb 3.4 oz)    History of present illness:  34 year old female who  has a past medical history of Anemia and Heartburn in pregnancy. Today was brought to the ED from OB/GYN office after patient had 2 back-to-back episodes of syncope. The first episode of hypotension when she was sitting at rest patient felt dizzy warm and sweaty and felt the heart was beating out and had a witnessed syncopal episode of unknown duration. 10 minutes later she had as a syncopal episode while waiting in line. Patient  denies any injury, no chest pain or shortness of breath. No blurred vision, no seizure-like activity. She denies nausea vomiting or diarrhea. No fever no dysuria. Patient had similar episodes a few years ago, at that time she had event monitor placed which failed to show any significant arrhythmia.  EKG in the ED showed QTC prolongation. QTc interval 567.  Hospital Course:   Syncope- likely vasovagal, repeat EKG done this morning shows normal QTC 0.45. Cardiology feels that patient had a vasovagal episode. Echocardiogram done today is normal. Patient'Cochran overnight telemetry was normal sinus rhythm. At this time cardiology does not recommend any further investigation at this time. Cardec enzymes 3 are negative.  ? Seizure Patient had one episode of seizure 2 years ago, but yesterday she did not have seizure-like activity. There was no loss of bowel or bladder control, no jerky movements noted him a no post  confusion, no tongue biting. I will cancel the EEG. Patient has been explained in detail that if she gets these episodes again, she might need neurology evaluation including EEG.  Procedures:  Echocardiogram  Consultations:  Cardiology  Discharge Exam: Filed Vitals:   02/11/14 1338  BP: 122/73  Pulse: 67  Temp: 98.2 F (36.8 C)  Resp: 18    General: Appears in no acute distress Cardiovascular: S1-S2 regular Respiratory: Clear to auscultation bilaterally  Discharge Instructions   Discharge Instructions    Diet - low sodium heart healthy    Complete by:  As directed      Increase activity slowly    Complete by:  As directed           Current Discharge Medication List    CONTINUE these medications which have NOT CHANGED   Details  ibuprofen (ADVIL,MOTRIN) 800 MG tablet Take 800 mg by mouth 3 (three) times daily as needed (For pain.).     miconazole (MONISTAT 7) 2 % vaginal cream Place 1 Applicatorful vaginally at bedtime.    oxyCODONE-acetaminophen (PERCOCET/ROXICET) 5-325 MG per tablet Take 1-2 tablets by mouth every 4 (four) hours as needed. Qty: 20 tablet, Refills: 0      STOP taking these medications     amoxicillin (AMOXIL) 500 MG capsule      fluconazole (DIFLUCAN) 150 MG tablet        Allergies  Allergen Reactions  . Methergine [Methylergonovine Maleate]     Seizure activity      The results of significant diagnostics from this hospitalization (including imaging, microbiology, ancillary and laboratory) are listed below  for reference.    Significant Diagnostic Studies: No results found.  Microbiology: No results found for this or any previous visit (from the past 240 hour(Cochran)).   Labs: Basic Metabolic Panel:  Recent Labs Lab 02/10/14 1257 02/10/14 1425 02/11/14 0611  NA 139 139 142  K 3.5 3.6 4.1  CL 102 105 109  CO2  --  25 26  GLUCOSE 94 88 98  BUN 10 12 13   CREATININE 0.80 0.84 0.78  CALCIUM  --  9.4 8.8  MG  --  2.0  --     Liver Function Tests:  Recent Labs Lab 02/11/14 0611  AST 13  ALT 11  ALKPHOS 45  BILITOT 0.7  PROT 6.7  ALBUMIN 3.3*   No results for input(Cochran): LIPASE, AMYLASE in the last 168 hours. No results for input(Cochran): AMMONIA in the last 168 hours. CBC:  Recent Labs Lab 02/10/14 1257 02/10/14 1425 02/11/14 0611  WBC  --  9.8 7.4  NEUTROABS  --  7.4  --   HGB 13.3 12.1 10.9*  HCT 39.0 34.3* 31.3*  MCV  --  76.6* 75.8*  PLT  --  301 288   Cardiac Enzymes:  Recent Labs Lab 02/10/14 1425 02/10/14 1757 02/11/14 0023 02/11/14 0611  TROPONINI <0.03 <0.03 <0.03 <0.03   BNP: BNP (last 3 results) No results for input(Cochran): PROBNP in the last 8760 hours. CBG: No results for input(Cochran): GLUCAP in the last 168 hours.     SignedMauro Kaufmann:  Denise Cochran  [facility] 02/11/2014, 2:00 PM

## 2014-02-13 ENCOUNTER — Other Ambulatory Visit: Payer: Self-pay | Admitting: Obstetrics & Gynecology

## 2014-11-14 ENCOUNTER — Other Ambulatory Visit: Payer: Self-pay | Admitting: Obstetrics and Gynecology

## 2014-11-15 LAB — CYTOLOGY - PAP

## 2015-08-29 ENCOUNTER — Ambulatory Visit (HOSPITAL_COMMUNITY)
Admission: RE | Admit: 2015-08-29 | Discharge: 2015-08-29 | Disposition: A | Discharge status: Home / Self Care | Payer: BLUE CROSS/BLUE SHIELD | Source: Ambulatory Visit | Attending: Pulmonary Disease | Admitting: Pulmonary Disease

## 2015-08-29 DIAGNOSIS — R002 Palpitations: Secondary | ICD-10-CM | POA: Insufficient documentation

## 2015-11-21 ENCOUNTER — Other Ambulatory Visit: Payer: Self-pay | Admitting: Obstetrics and Gynecology

## 2015-11-22 LAB — CYTOLOGY - PAP

## 2016-11-10 ENCOUNTER — Encounter (HOSPITAL_COMMUNITY): Payer: Self-pay | Admitting: *Deleted

## 2016-11-10 ENCOUNTER — Emergency Department (HOSPITAL_COMMUNITY)
Admission: EM | Admit: 2016-11-10 | Discharge: 2016-11-11 | Disposition: A | Discharge status: Home / Self Care | Payer: BLUE CROSS/BLUE SHIELD | Attending: Emergency Medicine | Admitting: Emergency Medicine

## 2016-11-10 ENCOUNTER — Emergency Department (HOSPITAL_COMMUNITY): Payer: BLUE CROSS/BLUE SHIELD

## 2016-11-10 DIAGNOSIS — R110 Nausea: Secondary | ICD-10-CM | POA: Diagnosis not present

## 2016-11-10 DIAGNOSIS — K5900 Constipation, unspecified: Secondary | ICD-10-CM | POA: Insufficient documentation

## 2016-11-10 DIAGNOSIS — R1032 Left lower quadrant pain: Secondary | ICD-10-CM | POA: Diagnosis present

## 2016-11-10 LAB — COMPREHENSIVE METABOLIC PANEL
ALT: 12 U/L — ABNORMAL LOW (ref 14–54)
AST: 16 U/L (ref 15–41)
Albumin: 4.1 g/dL (ref 3.5–5.0)
Alkaline Phosphatase: 57 U/L (ref 38–126)
Anion gap: 10 (ref 5–15)
BUN: 14 mg/dL (ref 6–20)
CO2: 24 mmol/L (ref 22–32)
Calcium: 9.1 mg/dL (ref 8.9–10.3)
Chloride: 102 mmol/L (ref 101–111)
Creatinine, Ser: 0.85 mg/dL (ref 0.44–1.00)
GFR calc Af Amer: >60 mL/min (ref >60)
GFR calc non Af Amer: >60 mL/min (ref >60)
Glucose, Bld: 92 mg/dL (ref 65–99)
Potassium: 3.4 mmol/L — ABNORMAL LOW (ref 3.5–5.1)
Sodium: 136 mmol/L (ref 135–145)
Total Bilirubin: 0.5 mg/dL (ref 0.3–1.2)
Total Protein: 8.3 g/dL — ABNORMAL HIGH (ref 6.5–8.1)

## 2016-11-10 LAB — CBC WITH DIFFERENTIAL/PLATELET
Basophils Absolute: 0 10*3/uL (ref 0.0–0.1)
Basophils Relative: 0 %
Eosinophils Absolute: 0.1 10*3/uL (ref 0.0–0.7)
Eosinophils Relative: 1 %
HCT: 32.3 % — ABNORMAL LOW (ref 36.0–46.0)
Hemoglobin: 11.5 g/dL — ABNORMAL LOW (ref 12.0–15.0)
Lymphocytes Relative: 25 %
Lymphs Abs: 2.8 10*3/uL (ref 0.7–4.0)
MCH: 27.1 pg (ref 26.0–34.0)
MCHC: 35.6 g/dL (ref 30.0–36.0)
MCV: 76.2 fL — ABNORMAL LOW (ref 78.0–100.0)
Monocytes Absolute: 0.7 10*3/uL (ref 0.1–1.0)
Monocytes Relative: 6 %
Neutro Abs: 7.7 10*3/uL (ref 1.7–7.7)
Neutrophils Relative %: 68 %
Platelets: 312 10*3/uL (ref 150–400)
RBC: 4.24 MIL/uL (ref 3.87–5.11)
RDW: 14.7 % (ref 11.5–15.5)
WBC: 11.3 10*3/uL — ABNORMAL HIGH (ref 4.0–10.5)

## 2016-11-10 LAB — URINALYSIS, ROUTINE W REFLEX MICROSCOPIC
Bilirubin Urine: NEGATIVE
Glucose, UA: NEGATIVE mg/dL
Hgb urine dipstick: NEGATIVE
Ketones, ur: NEGATIVE mg/dL
Leukocytes, UA: NEGATIVE
Nitrite: NEGATIVE
Protein, ur: NEGATIVE mg/dL
Specific Gravity, Urine: 1.024 (ref 1.005–1.030)
pH: 5 (ref 5.0–8.0)

## 2016-11-10 LAB — PREGNANCY, URINE: Preg Test, Ur: NEGATIVE

## 2016-11-10 LAB — WET PREP, GENITAL
Clue Cells Wet Prep HPF POC: NONE SEEN
Sperm: NONE SEEN
Trich, Wet Prep: NONE SEEN
Yeast Wet Prep HPF POC: NONE SEEN

## 2016-11-10 LAB — LIPASE, BLOOD: Lipase: 19 U/L (ref 11–51)

## 2016-11-10 MED ORDER — DOXYCYCLINE HYCLATE 100 MG PO TABS
100.0000 mg | ORAL_TABLET | Freq: Once | ORAL | Status: AC
  Administered 2016-11-11: 100 mg via ORAL
  Filled 2016-11-10: qty 1

## 2016-11-10 MED ORDER — IOPAMIDOL (ISOVUE-300) INJECTION 61%
100.0000 mL | Freq: Once | INTRAVENOUS | Status: AC | PRN
  Administered 2016-11-10: 100 mL via INTRAVENOUS

## 2016-11-10 MED ORDER — CEFTRIAXONE SODIUM 250 MG IJ SOLR
250.0000 mg | INTRAMUSCULAR | Status: DC
  Administered 2016-11-11: 250 mg via INTRAMUSCULAR
  Filled 2016-11-10: qty 250

## 2016-11-10 NOTE — ED Triage Notes (Signed)
Left lower quadrant pain radiating into thigh

## 2016-11-10 NOTE — ED Provider Notes (Signed)
[facility] EMERGENCY DEPARTMENT Provider Note   CSN: [id number] Arrival date & time: 11/10/16  1933     History   Chief Complaint Chief Complaint  Patient presents with  . Abdominal Pain    HPI Denise Cochran is a 36 y.o. female who presents today with chief complaint acute onset, presently worsening intermittent left lower quadrant abdominal pain. Patient states pain began 3-4 days ago and is described as a crampy pain which radiates down the left thigh. Pain worsens when she flexes her left hip. Endorses associated nausea but no vomiting. Also states she feels constipated, had a bowel movement earlier today but "it was hard". Denies fevers, chills, urinary symptoms, vaginal itching, discharge, or bleeding, melena, or hematochezia. Has not tried anything for her symptoms. No known sick contacts, no suspicious food intake. She had a tubal ligation 4 years ago. Last menstrual period was October 8, lasted 4 days and was normal for her. She is currently sexually active with 1 female partner who is a symptomatic.  The history is provided by the patient.    Past Medical History:  Diagnosis Date  . Anemia   . Heartburn in pregnancy     Patient Active Problem List   Diagnosis Date Noted  . 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 Syncope 02/10/2014    Past Surgical History:  Procedure Laterality Date  . CESAREAN SECTION    . CESAREAN SECTION N/A 05/20/2012   Procedure: CESAREAN SECTION;  Surgeon: Mickel Baas, MD;  Location: [facility];  Service: Obstetrics;  Laterality: N/A;  . DILATION AND EVACUATION N/A 05/20/2012   Procedure: DILATATION AND EVACUATION;  Surgeon: Mickel Baas, MD;  Location: [facility];  Service: Gynecology;  Laterality: N/A;  . TUBAL LIGATION Bilateral 05/20/2012   Procedure: BILATERAL TUBAL LIGATION;  Surgeon: Mickel Baas, MD;  Location: [facility];  Service: Obstetrics;  Laterality: Bilateral;    OB History    Gravida Para Term Preterm AB Living   3 3 3     4    SAB TAB Ectopic Multiple Live Births         1 2       Home Medications    Prior to Admission medications   Medication Sig Start Date End Date Taking? Authorizing Provider  doxycycline (VIBRAMYCIN) 100 MG capsule Take 1 capsule (100 mg total) by mouth 2 (two) times daily. 11/11/16 11/18/16  Jeanie Sewer, PA-C    Family History Family History  Problem Relation Age of Onset  . Sudden death Brother 11    Social History Social History  Substance Use Topics  . Smoking status: Never Smoker  . Smokeless tobacco: Never Used  . Alcohol use No     Allergies   Methergine [methylergonovine maleate]   Review of Systems Review of Systems  Constitutional: Negative for chills and fever.  Respiratory: Negative for shortness of breath.   Cardiovascular: Negative for chest pain.  Gastrointestinal: Positive for abdominal pain, constipation and nausea. Negative for blood in stool and vomiting.  Genitourinary: Negative for dysuria, flank pain, frequency, hematuria, vaginal bleeding, vaginal discharge and vaginal pain.  All other systems reviewed and are negative.    Physical Exam Updated Vital Signs BP 135/84 (BP Location: Left Arm)   Pulse 78   Temp 98.3 F (36.8 C) (Oral)   Resp 18   Ht 5\' 5"  (1.651 m)   Wt 97.5 kg (215 lb)   SpO2 99%   BMI 35.78 kg/m   Physical Exam  Constitutional: She appears well-developed and well-nourished. No distress.  HENT:  Head: Normocephalic and atraumatic.  Eyes: Conjunctivae are normal. Right eye exhibits no discharge. Left eye exhibits no discharge.  Neck: Normal range of motion. Neck supple. No JVD present. No tracheal deviation present.  Cardiovascular: Normal rate, regular rhythm and normal heart sounds.   Pulmonary/Chest: Effort normal and breath sounds normal.  Abdominal: Soft. She exhibits no distension. There is tenderness. There is guarding.  Slightly hypoactive bowel sounds. Tender to palpation in the left lower quadrant. Murphy  sign absent, Rovsing's absent, no TTP at McBurney's point. No CVA tenderness  Genitourinary:  Genitourinary Comments: Examination performed in the presence of a chaperone. No lesions to the external genitalia. Cervical os is closed with no bleeding. Moderate amount of white discharge in the vaginal vault. No masses or lesions to the vaginal wall. No cervical motion tenderness or adnexal tenderness.  Musculoskeletal: She exhibits no edema.  Neurological: She is alert.  Skin: Skin is warm and dry. No erythema.  Psychiatric: She has a normal mood and affect. Her behavior is normal.  Nursing note and vitals reviewed.    ED Treatments / Results  Labs (all labs ordered are listed, but only abnormal results are displayed) Labs Reviewed  WET PREP, GENITAL - Abnormal; Notable for the following:       Result Value   WBC, Wet Prep HPF POC MODERATE (*)    All other components within normal limits  CBC WITH DIFFERENTIAL/PLATELET - Abnormal; Notable for the following:    WBC 11.3 (*)    Hemoglobin 11.5 (*)    HCT 32.3 (*)    MCV 76.2 (*)    All other components within normal limits  COMPREHENSIVE METABOLIC PANEL - Abnormal; Notable for the following:    Potassium 3.4 (*)    Total Protein 8.3 (*)    ALT 12 (*)    All other components within normal limits  LIPASE, BLOOD  URINALYSIS, ROUTINE W REFLEX MICROSCOPIC  PREGNANCY, URINE  GC/CHLAMYDIA PROBE AMP ([place]) NOT AT [facility]    EKG  EKG Interpretation None       Radiology Ct Abdomen Pelvis W Contrast  Result Date: 11/10/2016 CLINICAL DATA:  Left lower quadrant pain.  Diverticulitis suspected. EXAM: CT ABDOMEN AND PELVIS WITH CONTRAST TECHNIQUE: Multidetector CT imaging of the abdomen and pelvis was performed using the standard protocol following bolus administration of intravenous contrast. CONTRAST:  100mL ISOVUE-300 IOPAMIDOL (ISOVUE-300) INJECTION 61% COMPARISON:  None. FINDINGS: Lower chest: Lung bases are clear allowing for  mild breathing motion. Hepatobiliary: Multiple tiny subcentimeter hepatic hypodensities throughout both lobes of the liver may be small cysts or biliary hamartomas, too small to accurately characterize. No suspicious hepatic lesion. Gallbladder physiologically distended, no calcified stone. No biliary dilatation. Pancreas: No ductal dilatation or inflammation. Spleen: Normal in size without focal abnormality. Adrenals/Urinary Tract: Normal adrenal glands. No hydronephrosis or perinephric edema. Homogeneous renal enhancement. Urinary bladder is physiologically distended without wall thickening. Stomach/Bowel: Stomach is nondistended. No small bowel obstruction, inflammation or wall thickening. There is mild fecalization of distal small bowel contents. Moderate colonic stool burden. No significant diverticular disease. No colonic inflammation. The appendix is not confidently visualized, no pericecal or right lower quadrant inflammation to suggest appendicitis. Vascular/Lymphatic: Dilatation of both ovarian veins, 8 mm on the right and 7 mm on the left. Normal caliber abdominal aorta. Few prominent ileocolic nodes are likely reactive. No pathologically enlarged lymph nodes. Reproductive: Prominent bilateral adnexal vascularity. No adnexal mass. Tubal  ligation clip adjacent to the left fallopian tube, in expected location. A second surgical clip is noted in the left pelvis, no surgical clip is seen in the right adnexa. Unremarkable CT appearance of the uterus. Other: No free air, free fluid, or intra-abdominal fluid collection. Musculoskeletal: There are no acute or suspicious osseous abnormalities. IMPRESSION: 1. Prominent bilateral adnexal vascularity with dilatation of the ovarian veins, can be seen with pelvic congestion, and may be cause of pelvic pain. 2. Abnormal position of 1 of the tubal ligation clips, with no tubal ligation clip in the right adnexa, and a clip dependently in the left pelvis. Normal appearing  tubal ligation clip is seen adjacent to the left fallopian tube. Recommend HSG to confirm tubal occlusion. 3. No diverticulitis or significant diverticular disease. Moderate colonic stool burden which can be seen with constipation. Electronically Signed   By: Rubye Oaks M.D.   On: 11/10/2016 23:40    Procedures Procedures (including critical care time)  Medications Ordered in ED Medications  cefTRIAXone (ROCEPHIN) injection 250 mg (250 mg Intramuscular Given 11/11/16 0047)  iopamidol (ISOVUE-300) 61 % injection 100 mL (100 mLs Intravenous Contrast Given 11/10/16 2303)  doxycycline (VIBRA-TABS) tablet 100 mg (100 mg Oral Given 11/11/16 0048)  lidocaine (PF) (XYLOCAINE) 1 % injection (0.9 mLs  Given 11/11/16 0048)     Initial Impression / Assessment and Plan / ED Course  I have reviewed the triage vital signs and the nursing notes.  Pertinent labs & imaging results that were available during my care of the patient were reviewed by me and considered in my medical decision making (see chart for details).      Patient with progressively worsening left lower quadrant pain. Afebrile, vital signs are stable. Labwork is unremarkable. UA is not concerning for UTI, patient pregnancy test is negative. Pelvic examination is not significantly suggestive of PID although there are moderate WBCs on wet prep. Obtained a CT scan of the abdomen and pelvis which shows prominent bilateral adnexal vascularity with dilatation of the ovarian veins. Also shows abnormal position of 1 of the tubal ligation clips, with no tubal ligation clip in the right adnexa, and a clip dependently in the left pelvis. Normal appearing tubal ligation clip is seen adjacent to the left fallopian tube. Moderate colonic stool burden which can be seen with constipation. Low suspicion of appendicitis, colitis, obstruction, or perforation. Low suspicion of ectopic pregnancy. Adnexal vascularity could be suggestive of an inflammatory or  infectious process, will cover for STD empirically. Will give doxycycline as patient cannot tolerate Zithromax due to her prolonged QT. She has follow up with her OB/GYN scheduled in the next 2 weeks. Discussed indications for return to the ED. Pt verbalized understanding of and agreement with plan and is safe for discharge home at this time.  Final Clinical Impressions(s) / ED Diagnoses   Final diagnoses:  Left lower quadrant pain    New Prescriptions Discharge Medication List as of 11/11/2016 12:09 AM    START taking these medications   Details  doxycycline (VIBRAMYCIN) 100 MG capsule Take 1 capsule (100 mg total) by mouth 2 (two) times daily., Starting Tue 11/11/2016, Until Tue 11/18/2016, Print         Luevenia Maxin, [place] A, PA-C 11/11/16 1610    Loren Racer, MD 11/14/16 1334

## 2016-11-11 MED ORDER — DOXYCYCLINE HYCLATE 100 MG PO CAPS: 100.0000 mg | ORAL_CAPSULE | Freq: Two times a day (BID) | ORAL | 0 refills | Status: AC

## 2016-11-11 MED ORDER — LIDOCAINE HCL (PF) 1 % IJ SOLN
INTRAMUSCULAR | Status: AC
  Administered 2016-11-11: 0.9 mL
  Filled 2016-11-11: qty 2

## 2016-11-11 NOTE — Discharge Instructions (Signed)
Drink plenty of fluids and get plenty of rest. Alternate 600 mg of ibuprofen and [phone] mg of Tylenol every 3 hours as needed for pain. Do not exceed 4000 mg of Tylenol daily.  Please take all of your antibiotics until finished!   You may develop abdominal discomfort or diarrhea from the antibiotic.  You may help offset this with probiotics which you can buy or get in yogurt. Do not eat  or take the probiotics until 2 hours after your antibiotic.   Follow-up with your OB/GYN for reevaluation of your symptoms. The CT scan that you had today made mention that they could not visualize the tubal ligation clip on the right. Return to the ED if any concerning signs or symptoms develop.

## 2016-11-12 LAB — GC/CHLAMYDIA PROBE AMP (~~LOC~~) NOT AT ARMC
Chlamydia: NEGATIVE
Neisseria Gonorrhea: NEGATIVE

## 2018-05-26 IMAGING — CT CT ABD-PELV W/ CM
2 of 4 series · 16 of 46 positions shown, 18 images · IV contrast (Isovue)
Comparison: None.

CLINICAL DATA: Left lower quadrant pain.  Diverticulitis suspected.

EXAM:
CT ABDOMEN AND PELVIS WITH CONTRAST
TECHNIQUE: Multidetector CT imaging of the abdomen and pelvis was performed
using the standard protocol following bolus administration of
intravenous contrast.
CONTRAST:  100mL [id number] IOPAMIDOL (Z4KKUY-J88) INJECTION 61%

[Series 2: axial st · axial · 0.98mm/px · z∈[+974,+1364]mm · 13 of 86 slices shown, 15 images]
[im 4/86  soft-tissue]
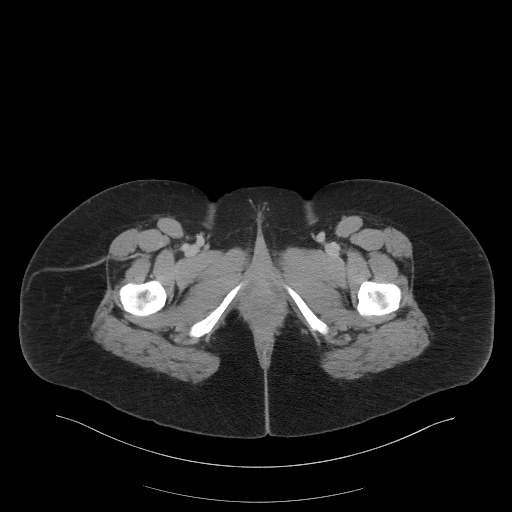
[im 4/86  bone]
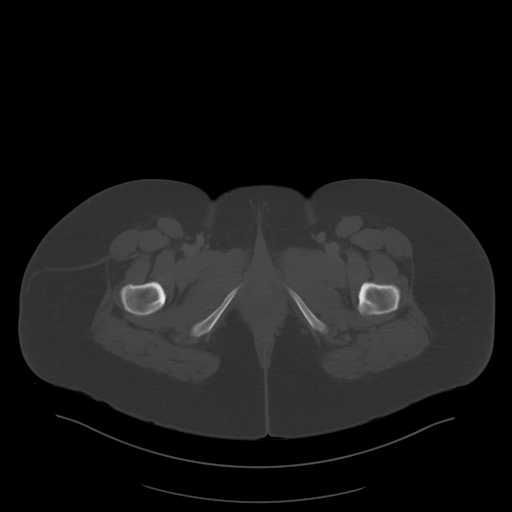
[im 11/86  soft-tissue]
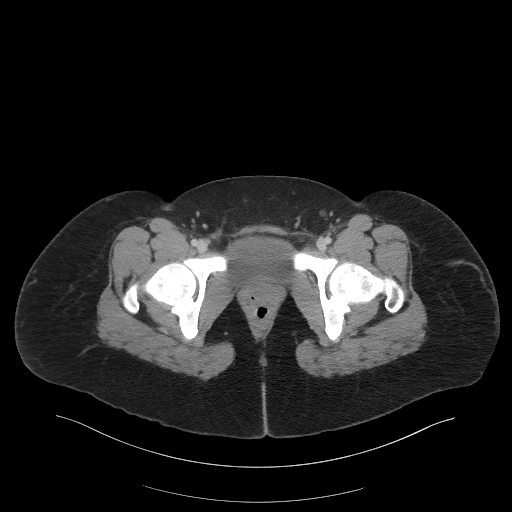
[im 18/86  soft-tissue]
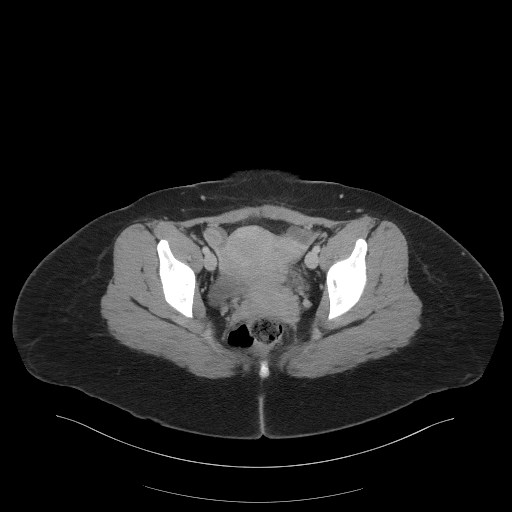
[im 24/86  soft-tissue]
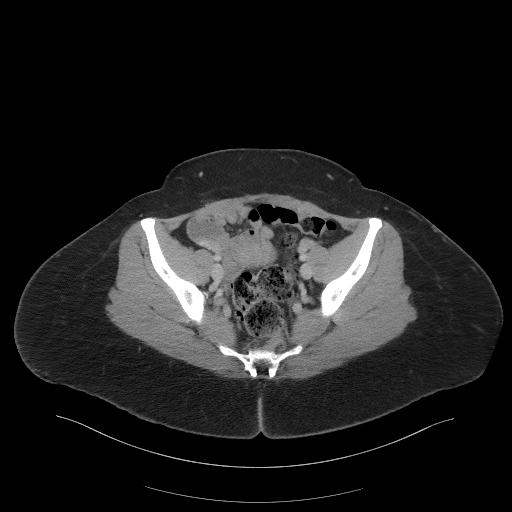
[im 31/86  soft-tissue]
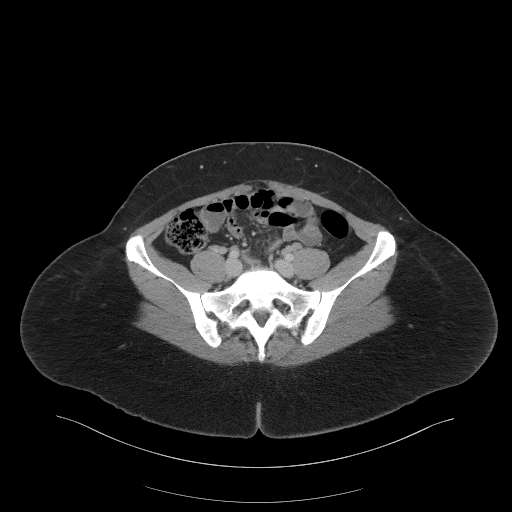
[im 38/86  soft-tissue]
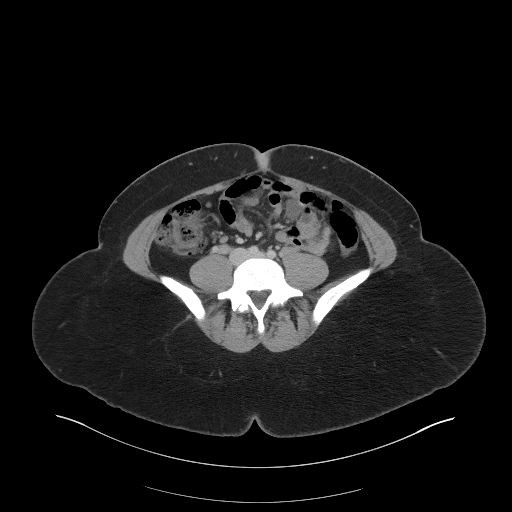
[im 45/86  soft-tissue]
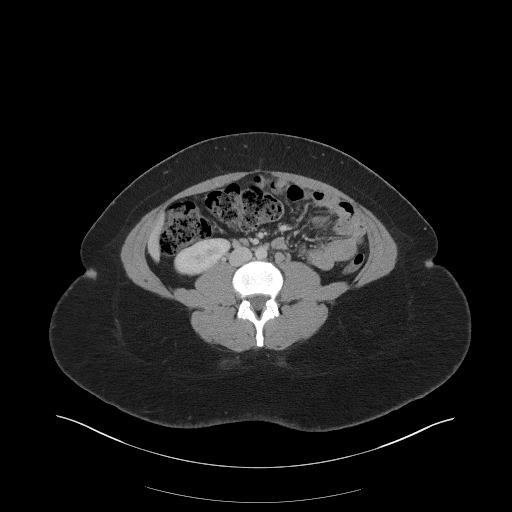
[im 48/86  soft-tissue]
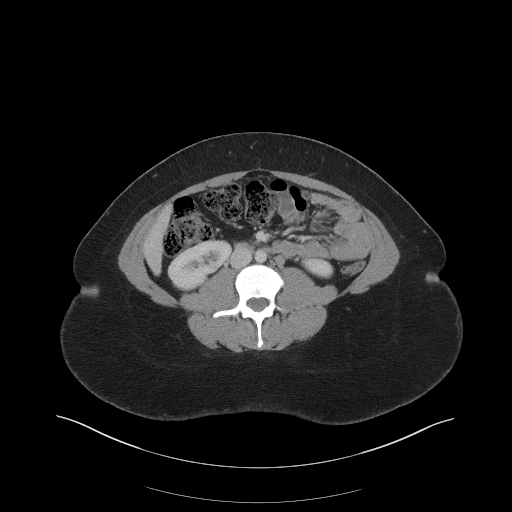
[im 55/86  soft-tissue]
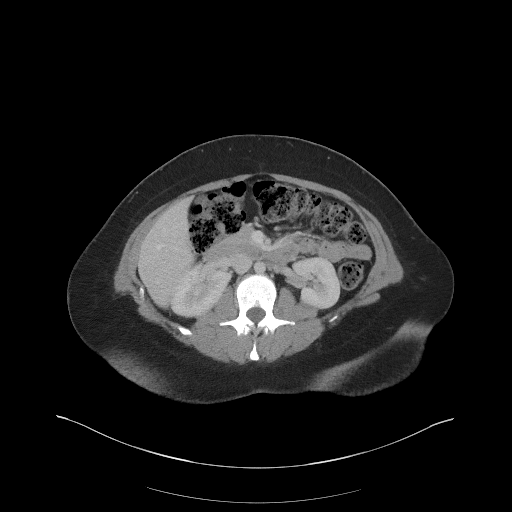
[im 55/86  bone]
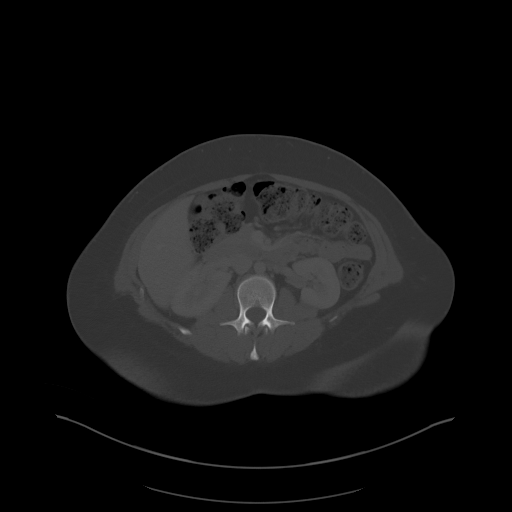
[im 62/86  soft-tissue]
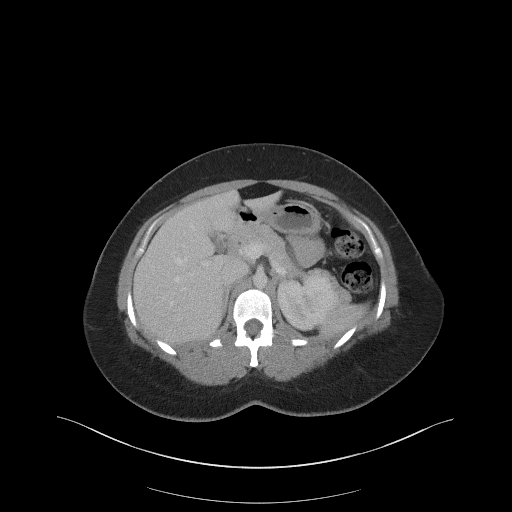
[im 69/86  soft-tissue]
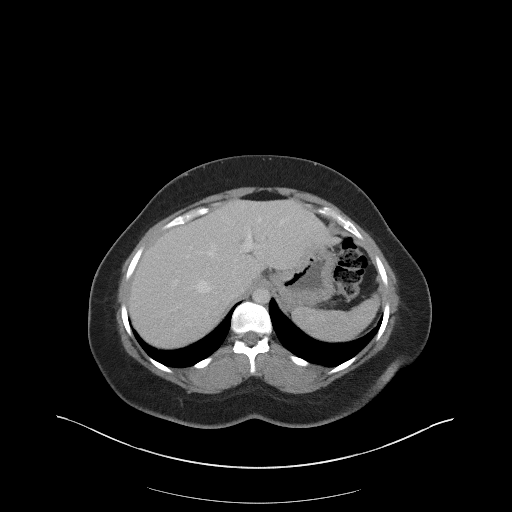
[im 75/86  soft-tissue]
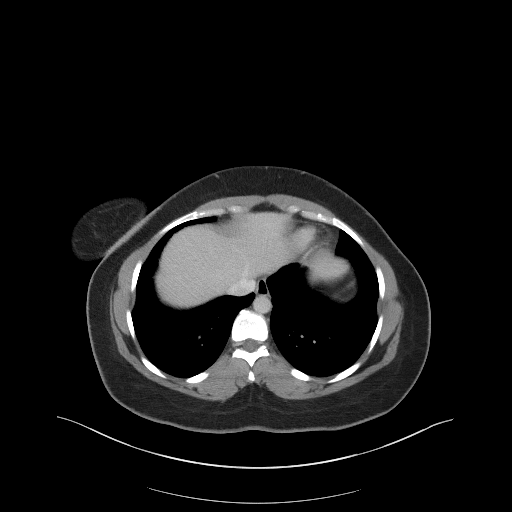
[im 82/86  soft-tissue]
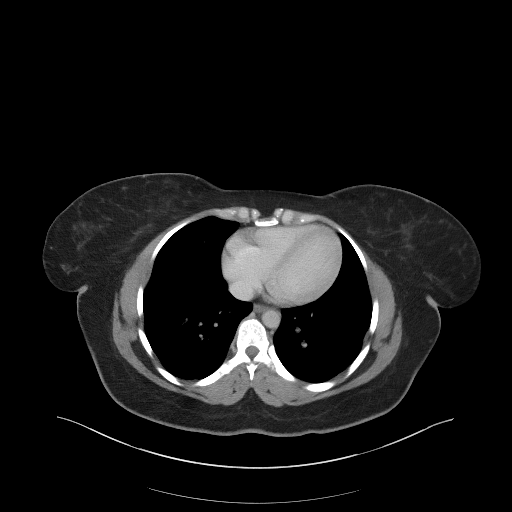

[Series 5: coronal st · coronal · 0.75mm/px · 3 of 90 slices shown]
[im 30/90  soft-tissue]
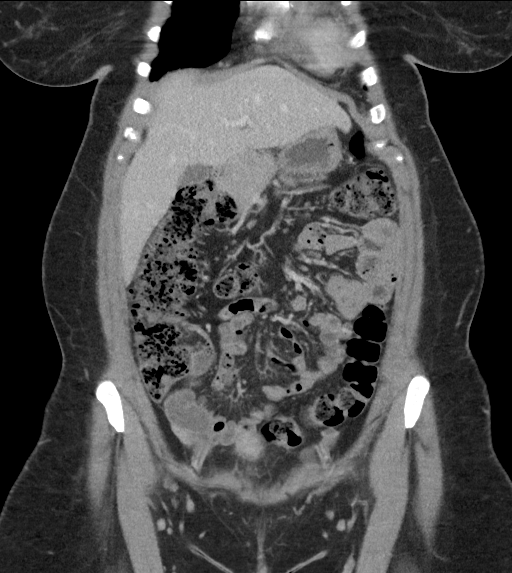
[im 40/90  soft-tissue]
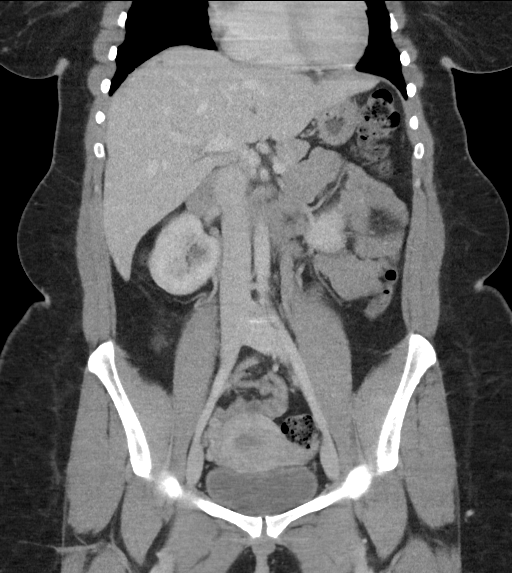
[im 50/90  soft-tissue]
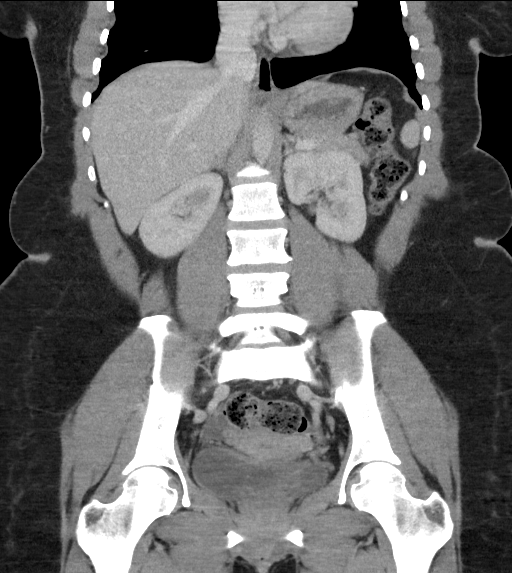

[16 of 46 positions shown; findings below may reference images not displayed]

FINDINGS: Lower chest: Lung bases are clear allowing for mild breathing
motion.

Hepatobiliary: Multiple tiny subcentimeter hepatic hypodensities
throughout both lobes of the liver may be small cysts or biliary
hamartomas, too small to accurately characterize. No suspicious
hepatic lesion. Gallbladder physiologically distended, no calcified
stone. No biliary dilatation.

Pancreas: No ductal dilatation or inflammation.

Spleen: Normal in size without focal abnormality.

Adrenals/Urinary Tract: Normal adrenal glands. No hydronephrosis or
perinephric edema. Homogeneous renal enhancement. Urinary bladder is
physiologically distended without wall thickening.

Stomach/Bowel: Stomach is nondistended. No small bowel obstruction,
inflammation or wall thickening. There is mild fecalization of
distal small bowel contents. Moderate colonic stool burden. No
significant diverticular disease. No colonic inflammation. The
appendix is not confidently visualized, no pericecal or right lower
quadrant inflammation to suggest appendicitis.

Vascular/Lymphatic: Dilatation of both ovarian veins, 8 mm on the
right and 7 mm on the left. Normal caliber abdominal aorta. Few
prominent ileocolic nodes are likely reactive. No pathologically
enlarged lymph nodes.

Reproductive: Prominent bilateral adnexal vascularity. No adnexal
mass. Tubal ligation clip adjacent to the left fallopian tube, in
expected location. A second surgical clip is noted in the left
pelvis, no surgical clip is seen in the right adnexa. Unremarkable
CT appearance of the uterus.

Other: No free air, free fluid, or intra-abdominal fluid collection.

Musculoskeletal: There are no acute or suspicious osseous
abnormalities.
IMPRESSION: 1. Prominent bilateral adnexal vascularity with dilatation of the
ovarian veins, can be seen with pelvic congestion, and may be cause
of pelvic pain.
2. Abnormal position of 1 of the tubal ligation clips, with no tubal
ligation clip in the right adnexa, and a clip dependently in the
left pelvis. Normal appearing tubal ligation clip is seen adjacent
to the left fallopian tube. Recommend HSG to confirm tubal
occlusion.
3. No diverticulitis or significant diverticular disease. Moderate
colonic stool burden which can be seen with constipation.

## 2018-09-21 ENCOUNTER — Ambulatory Visit
Admission: EM | Admit: 2018-09-21 | Discharge: 2018-09-21 | Disposition: A | Discharge status: Home / Self Care | Payer: BC Managed Care – PPO | Attending: Emergency Medicine | Admitting: Emergency Medicine

## 2018-09-21 ENCOUNTER — Other Ambulatory Visit: Payer: Self-pay

## 2018-09-21 DIAGNOSIS — R591 Generalized enlarged lymph nodes: Secondary | ICD-10-CM

## 2018-09-21 DIAGNOSIS — M542 Cervicalgia: Secondary | ICD-10-CM

## 2018-09-21 MED ORDER — IBUPROFEN 800 MG PO TABS: 800.0000 mg | ORAL_TABLET | Freq: Three times a day (TID) | ORAL | 0 refills | Status: DC

## 2018-09-21 NOTE — Discharge Instructions (Signed)
Get plenty of rest and push fluids Lymph node swelling most likely secondary to receiving flu shot prior to symptoms Take ibuprofen as needed for pain and swelling Follow up with PCP if symptoms persists or worsening (such as increase in size, pain, overlying redness, etc...) Return or go to ER if you have any new or worsening symptoms fever, chills, nausea, vomiting, chest pain, cough, shortness of breath, wheezing, sor throat, abdominal pain, changes in bowel or bladder habits, etc..Marland Kitchen

## 2018-09-21 NOTE — ED Provider Notes (Signed)
[facility]   [id number] 09/21/18 Arrival Time: 1018   CC: LAD; neck pain  SUBJECTIVE: History from: patient.  Denise Cochran is a 38 y.o. female who presents with left sided lymph node swelling and left sided neck pain that began 4 days ago.  Symptoms began after she received her flu shot 5 days ago.  Denies sick exposure to COVID, flu or strep.  Denies recent travel.  Has tried OTC ibuprofen and stretching with minimal relief.  Denies aggravating factors.  Denies fever, chills, fatigue, ear pain, nasal congestion, rhinorrhea, sore throat, SOB, wheezing, chest pain, cough, nausea, vomiting, changes in bowel or bladder habits.    Received flu shot this year: yes.  ROS: As per HPI.  All other pertinent ROS negative.     Past Medical History:  Diagnosis Date  . Anemia   . Heartburn in pregnancy    Past Surgical History:  Procedure Laterality Date  . CESAREAN SECTION    . CESAREAN SECTION N/A 05/20/2012   Procedure: CESAREAN SECTION;  Surgeon: Mickel Baasichard D Kaplan, MD;  Location: [facility];  Service: Obstetrics;  Laterality: N/A;  . DILATION AND EVACUATION N/A 05/20/2012   Procedure: DILATATION AND EVACUATION;  Surgeon: Mickel Baasichard D Kaplan, MD;  Location: [facility];  Service: Gynecology;  Laterality: N/A;  . TUBAL LIGATION Bilateral 05/20/2012   Procedure: BILATERAL TUBAL LIGATION;  Surgeon: Mickel Baasichard D Kaplan, MD;  Location: [facility];  Service: Obstetrics;  Laterality: Bilateral;   Allergies  Allergen Reactions  . Methergine [Methylergonovine Maleate]     Seizure activity   No current facility-administered medications on file prior to encounter.    No current outpatient medications on file prior to encounter.   Social History   Socioeconomic History  . Marital status: Married    Spouse name: Not on file  . Number of children: Not on file  . Years of education: Not on file  . Highest education level: Not on file  Occupational History  . Not on file  Social Needs  . Financial  resource strain: Not on file  . Food insecurity    Worry: Not on file    Inability: Not on file  . Transportation needs    Medical: Not on file    Non-medical: Not on file  Tobacco Use  . Smoking status: Never Smoker  . Smokeless tobacco: Never Used  Substance and Sexual Activity  . Alcohol use: No  . Drug use: No  . Sexual activity: Yes    Birth control/protection: None  Lifestyle  . Physical activity    Days per week: Not on file    Minutes per session: Not on file  . Stress: Not on file  Relationships  . Social Musicianconnections    Talks on phone: Not on file    Gets together: Not on file    Attends religious service: Not on file    Active member of club or organization: Not on file    Attends meetings of clubs or organizations: Not on file    Relationship status: Not on file  . Intimate partner violence    Fear of current or ex partner: Not on file    Emotionally abused: Not on file    Physically abused: Not on file    Forced sexual activity: Not on file  Other Topics Concern  . Not on file  Social History Narrative  . Not on file   Family History  Problem Relation Age of Onset  . Sudden death  Brother 93    OBJECTIVE:  Vitals:   09/21/18 1033  BP: 136/88  Pulse: 80  Resp: 20  Temp: 98.8 F (37.1 C)  SpO2: 98%     General appearance: alert; well-appearing, nontoxic; speaking in full sentences and tolerating own secretions HEENT: NCAT; Ears: EACs clear, TMs pearly gray; Eyes: PERRL.  EOM grossly intact. Nose: nares patent without rhinorrhea, Throat: oropharynx clear, tonsils non erythematous or enlarged, uvula midline  Neck: RT sided LAD; RT anterior cervical LAD; 1 freely moveable RT supraclavicular lymph node, mildly TTP, no overlying erythema Lungs: unlabored respirations, symmetrical air entry; cough: absent; no respiratory distress; CTAB Heart: regular rate and rhythm.  Radial pulses 2+ symmetrical bilaterally Skin: warm and dry Psychological: alert and  cooperative; normal mood and affect  ASSESSMENT & PLAN:  1. Lymphadenopathy of head and neck   2. Neck pain     Meds ordered this encounter  Medications  . ibuprofen (ADVIL) 800 MG tablet    Sig: Take 1 tablet (800 mg total) by mouth 3 (three) times daily.    Dispense:  21 tablet    Refill:  0    Order Specific Question:   Supervising Provider    Answer:   Raylene Everts [[id number]]    Get plenty of rest and push fluids Lymph node swelling most likely secondary to receiving flu shot prior to symptoms Take ibuprofen as needed for pain and swelling Follow up with PCP if symptoms persists or worsening (such as increase in size, pain, overlying redness, etc...) Return or go to ER if you have any new or worsening symptoms fever, chills, nausea, vomiting, chest pain, cough, shortness of breath, wheezing, sor throat, abdominal pain, changes in bowel or bladder habits, etc...  Reviewed expectations re: course of current medical issues. Questions answered. Outlined signs and symptoms indicating need for more acute intervention. Patient verbalized understanding. After Visit Summary given.         Lestine Box, PA-C 09/21/18 1056

## 2018-09-21 NOTE — ED Triage Notes (Signed)
Pt has flu shot last Thursday and began havin enlarged lymph node on right side and now having neck pain

## 2018-11-01 ENCOUNTER — Other Ambulatory Visit: Payer: Self-pay | Admitting: Internal Medicine

## 2018-11-01 ENCOUNTER — Other Ambulatory Visit (HOSPITAL_COMMUNITY): Payer: Self-pay | Admitting: Internal Medicine

## 2018-11-01 DIAGNOSIS — E041 Nontoxic single thyroid nodule: Secondary | ICD-10-CM

## 2018-11-05 ENCOUNTER — Other Ambulatory Visit: Payer: Self-pay

## 2018-11-05 ENCOUNTER — Ambulatory Visit (HOSPITAL_COMMUNITY)
Admission: RE | Admit: 2018-11-05 | Discharge: 2018-11-05 | Disposition: A | Discharge status: Home / Self Care | Payer: BC Managed Care – PPO | Source: Ambulatory Visit | Attending: Internal Medicine | Admitting: Internal Medicine

## 2018-11-05 DIAGNOSIS — E041 Nontoxic single thyroid nodule: Secondary | ICD-10-CM | POA: Insufficient documentation

## 2019-09-29 ENCOUNTER — Other Ambulatory Visit: Payer: Self-pay

## 2020-05-20 IMAGING — US US THYROID
1 series · 14 of 25 positions shown · non-contrast
Comparison: None.

CLINICAL DATA: Thyromegaly on exam

EXAM:
THYROID ULTRASOUND
TECHNIQUE: Ultrasound examination of the thyroid gland and adjacent soft
tissues was performed.

[Series 1: us thyroid · 14 of 46 slices shown]
[im 1/46]
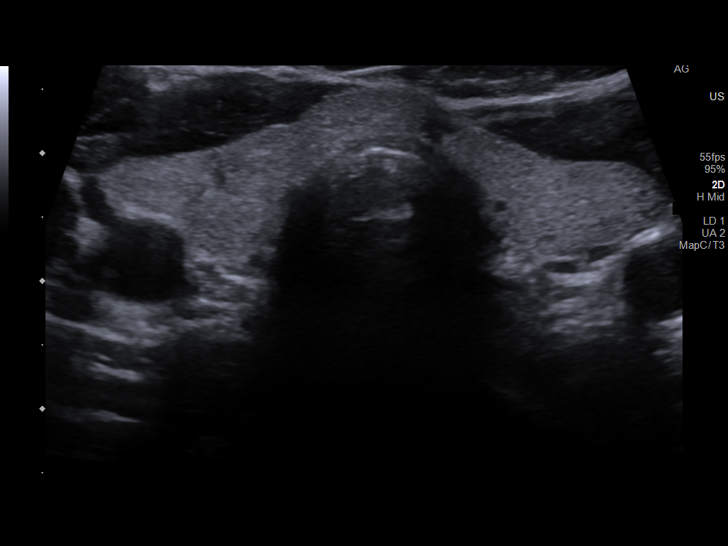
[im 4/46]
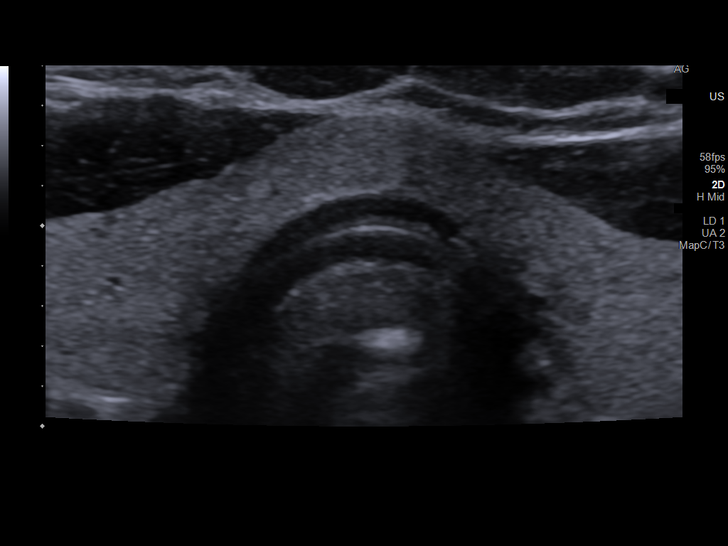
[im 8/46]
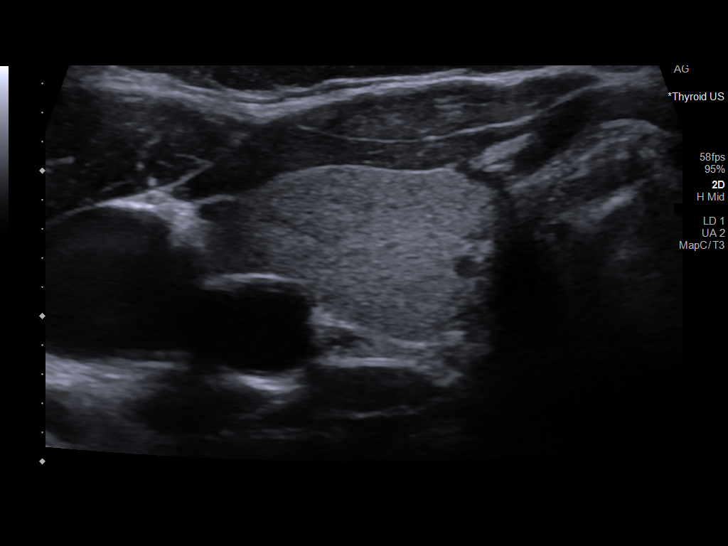
[im 12/46]
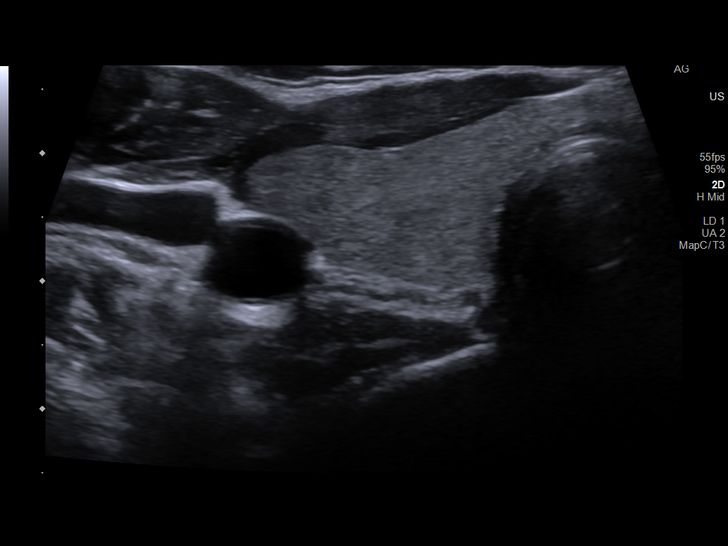
[im 16/46]
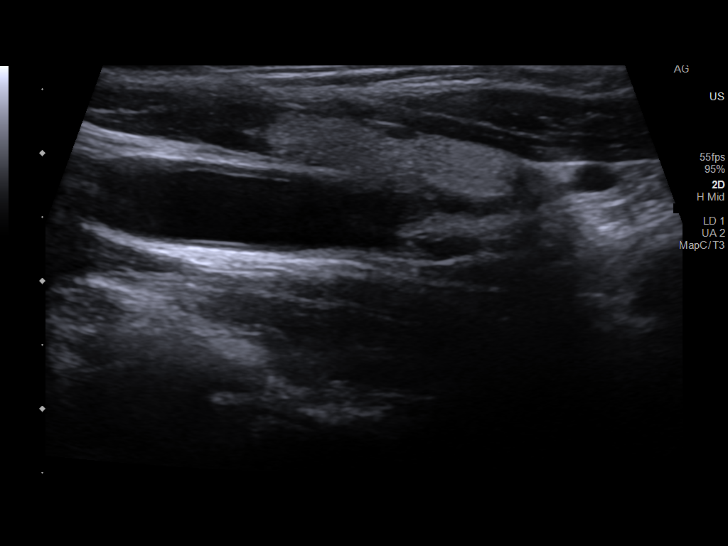
[im 17/46]
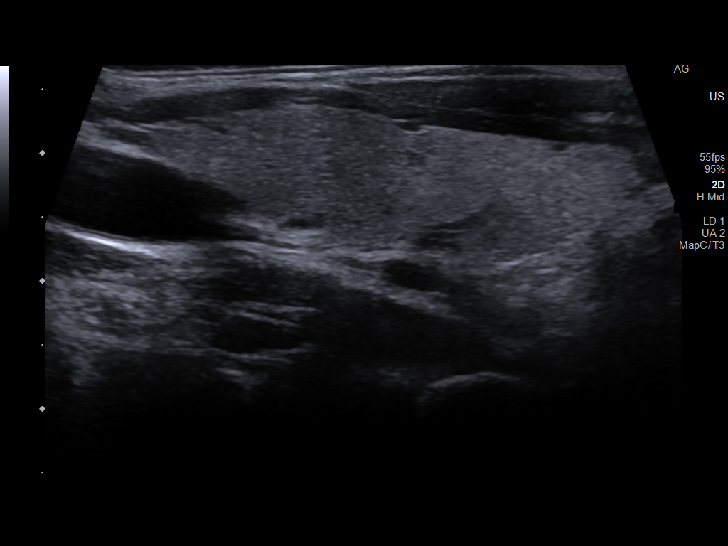
[im 21/46]
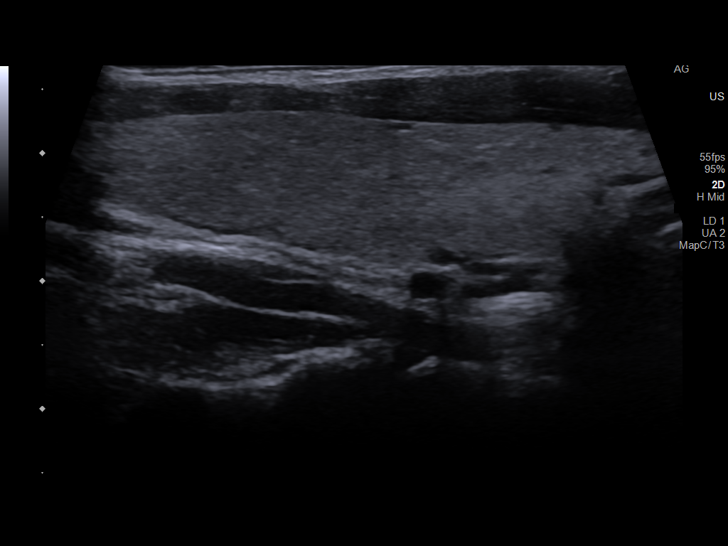
[im 25/46]
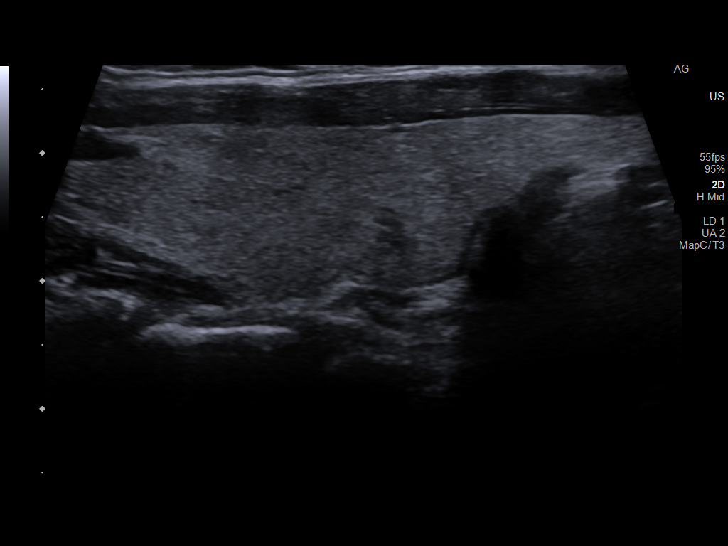
[im 29/46]
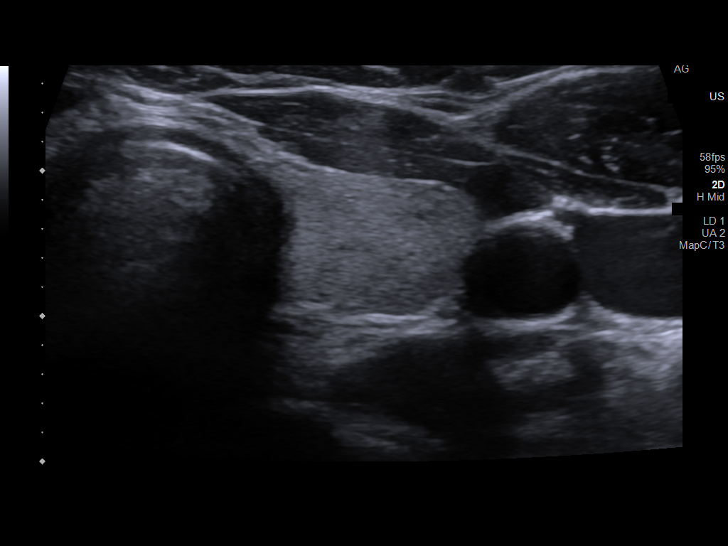
[im 31/46]
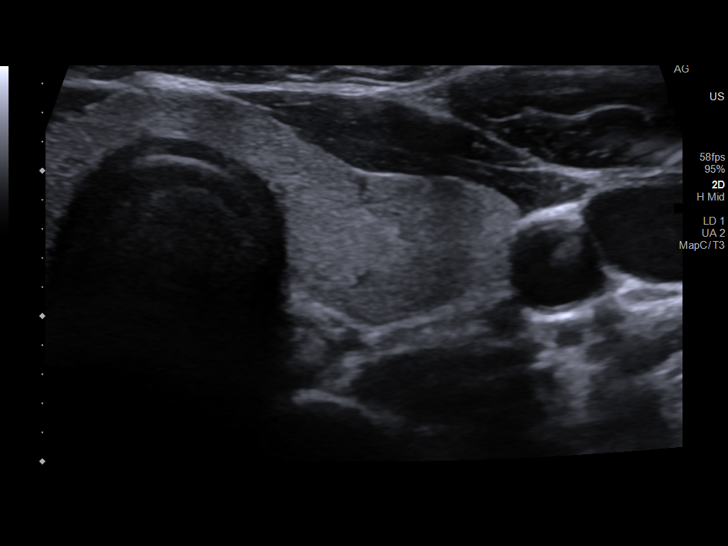
[im 34/46]
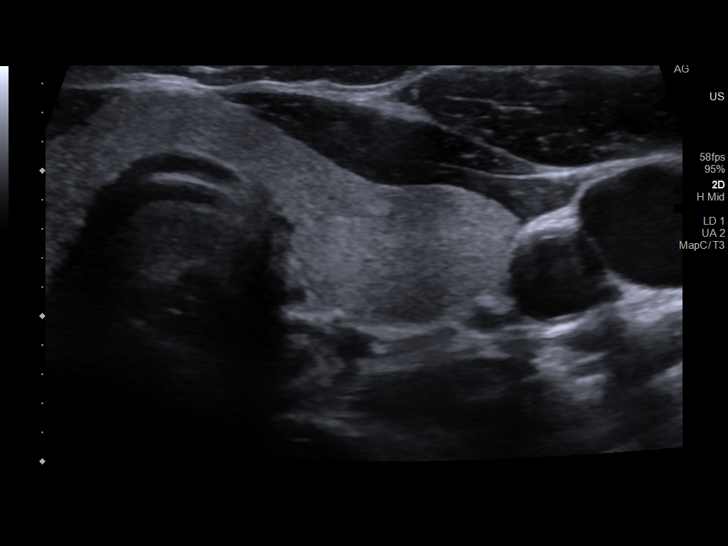
[im 38/46]
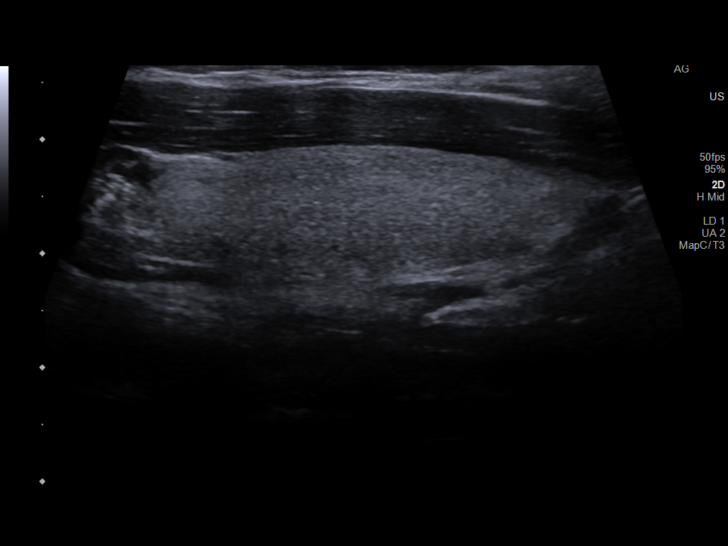
[im 42/46]
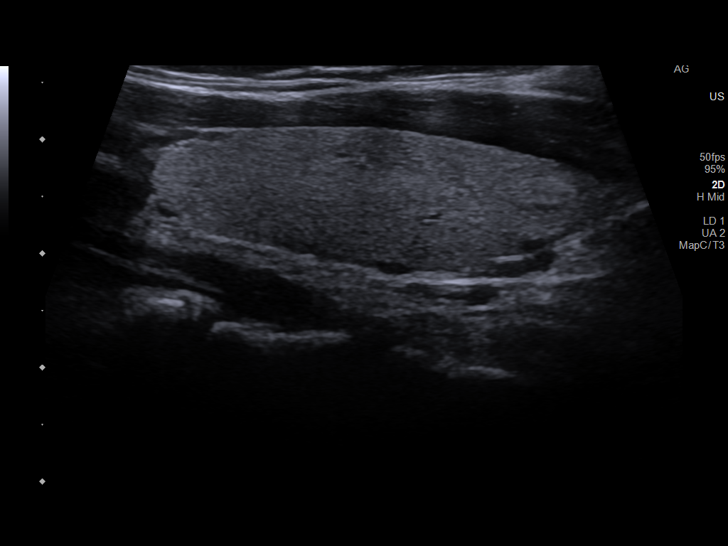
[im 46/46]
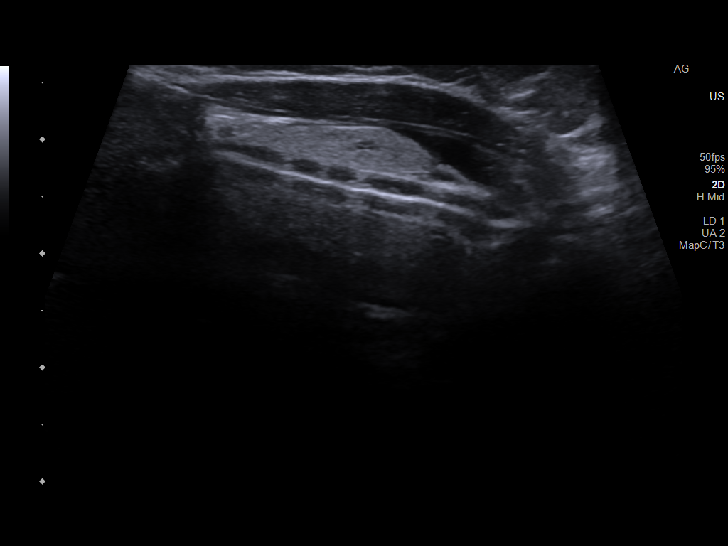

[14 of 25 positions shown; findings below may reference images not displayed]

FINDINGS: Parenchymal Echotexture: Normal

Isthmus: 4 mm

Right lobe: 4.7 x 1.3 x 2.1 cm

Left lobe: 4.0 x 1.2 x 1.6 cm

_________________________________________________________

Estimated total number of nodules >/= 1 cm: 0

Number of spongiform nodules >/=  2 cm not described below (TR1): 0

Number of mixed cystic and solid nodules >/= 1.5 cm not described
below (TR2): 0

_________________________________________________________

No discrete nodules are seen within the thyroid gland.
IMPRESSION: Normal thyroid ultrasound for age.

The above is in keeping with the ACR TI-RADS recommendations - [HOSPITAL] 2321;[DATE].

## 2020-07-26 ENCOUNTER — Other Ambulatory Visit: Payer: Self-pay

## 2020-07-26 ENCOUNTER — Other Ambulatory Visit: Payer: Self-pay | Admitting: Cardiology

## 2020-07-26 ENCOUNTER — Ambulatory Visit (INDEPENDENT_AMBULATORY_CARE_PROVIDER_SITE_OTHER): Payer: BC Managed Care – PPO | Admitting: Cardiology

## 2020-07-26 ENCOUNTER — Ambulatory Visit (INDEPENDENT_AMBULATORY_CARE_PROVIDER_SITE_OTHER): Payer: BC Managed Care – PPO

## 2020-07-26 ENCOUNTER — Encounter: Payer: Self-pay | Admitting: Cardiology

## 2020-07-26 VITALS — BP 132/84 | HR 88 | Ht 66.0 in | Wt 220.0 lb

## 2020-07-26 DIAGNOSIS — R002 Palpitations: Secondary | ICD-10-CM | POA: Diagnosis not present

## 2020-07-26 NOTE — Progress Notes (Signed)
Clinical Summary Denise Cochran is a 40 y.o.female seen as a new consult for the following medical problems, referred by Dr Denise Cochran   Palpitations - several year history, recent increase frequency - can have racing or pounding of beat, fluttering. Lasts a few minutes. Occurs few times a day. No specific trigger. Infrequent dizziness - no coffee. Mountain dew cans x 1-2, sweet tea daily x 2 glasses. No energy drinks, no EtoH - has been on metoprolol for a few years  - 2017 holter SR, rare PACs and PVCs   Past Medical History:  Diagnosis Date   Anemia    Heartburn in pregnancy      Allergies  Allergen Reactions   Methergine [Methylergonovine Maleate]     Seizure activity     Current Outpatient Medications  Medication Sig Dispense Refill   ibuprofen (ADVIL) 800 MG tablet Take 1 tablet (800 mg total) by mouth 3 (three) times daily. 21 tablet 0   No current facility-administered medications for this visit.     Past Surgical History:  Procedure Laterality Date   CESAREAN SECTION     CESAREAN SECTION N/A 05/20/2012   Procedure: CESAREAN SECTION;  Surgeon: Denise Baas, MD;  Location: [facility];  Service: Obstetrics;  Laterality: N/A;   DILATION AND EVACUATION N/A 05/20/2012   Procedure: DILATATION AND EVACUATION;  Surgeon: Denise Baas, MD;  Location: [facility];  Service: Gynecology;  Laterality: N/A;   TUBAL LIGATION Bilateral 05/20/2012   Procedure: BILATERAL TUBAL LIGATION;  Surgeon: Denise Baas, MD;  Location: [facility];  Service: Obstetrics;  Laterality: Bilateral;     Allergies  Allergen Reactions   Methergine [Methylergonovine Maleate]     Seizure activity      Family History  Problem Relation Age of Onset   Sudden death Brother 29     Social History Denise Cochran reports that she has never smoked. She has never used smokeless tobacco. Denise Cochran reports no history of alcohol use.   Review of Systems CONSTITUTIONAL: No weight loss, fever, chills,  weakness or fatigue.  HEENT: Eyes: No visual loss, blurred vision, double vision or yellow sclerae.No hearing loss, sneezing, congestion, runny nose or sore throat.  SKIN: No rash or itching.  CARDIOVASCULAR: per hpi RESPIRATORY: No shortness of breath, cough or sputum.  GASTROINTESTINAL: No anorexia, nausea, vomiting or diarrhea. No abdominal pain or blood.  GENITOURINARY: No burning on urination, no polyuria NEUROLOGICAL: No headache, dizziness, syncope, paralysis, ataxia, numbness or tingling in the extremities. No change in bowel or bladder control.  MUSCULOSKELETAL: No muscle, back pain, joint pain or stiffness.  LYMPHATICS: No enlarged nodes. No history of splenectomy.  PSYCHIATRIC: No history of depression or anxiety.  ENDOCRINOLOGIC: No reports of sweating, cold or heat intolerance. No polyuria or polydipsia.  Marland Kitchen   Physical Examination Today's Vitals   07/26/20 1310 07/26/20 1312  BP: 136/82 132/84  Pulse: 88   SpO2: 98%   Weight: 220 lb (99.8 kg)   Height: 5\' 6"  (1.676 m)    Body mass index is 35.51 kg/m.  Gen: resting comfortably, no acute distress HEENT: no scleral icterus, pupils equal round and reactive, no palptable cervical adenopathy,  CV: RRR, no m/r/g, no jvd Resp: Clear to auscultation bilaterally GI: abdomen is soft, non-tender, non-distended, normal bowel sounds, no hepatosplenomegaly MSK: extremities are warm, no edema.  Skin: warm, no rash Neuro:  no focal deficits Psych: appropriate affect   Diagnostic Studies  Jan 2016 echo Study Conclusions   -  Left ventricle: The cavity size was normal. Systolic function was    normal. The estimated ejection fraction was in the range of 55%    to 60%. Wall motion was normal; there were no regional wall    motion abnormalities. Left ventricular diastolic function    parameters were normal.  08/2015 holter 48 hour Holter monitor reviewed. Sinus rhythm present throughout. Heart rate ranged from 52 bpm up to 125  bpm with average heart rate 77 bpm. Rare PVCs and PACs noted (>1% total beats) with no sustained arrhythmias. There were no pauses.    Assessment and Plan  Palpitations - EKG today shows sinus arrhythmia - will plan for 7 day zio patch to further evaluate - discussed weaning caffeine   F/u 3 months      Denise Cochran, M.D.

## 2020-07-26 NOTE — Patient Instructions (Signed)
Medication Instructions:  Your physician recommends that you continue on your current medications as directed. Please refer to the Current Medication list given to you today.  *If you need a refill on your cardiac medications before your next appointment, please call your pharmacy*   Lab Work: None If you have labs (blood work) drawn today and your tests are completely normal, you will receive your results only by: MyChart Message (if you have MyChart) OR A paper copy in the mail If you have any lab test that is abnormal or we need to change your treatment, we will call you to review the results.   Testing/Procedures: None   Follow-Up: At [facility], you and your health needs are our priority.  As part of our continuing mission to provide you with exceptional heart care, we have created designated Provider Care Teams.  These Care Teams include your primary Cardiologist (physician) and Advanced Practice Providers (APPs -  Physician Assistants and Nurse Practitioners) who all work together to provide you with the care you need, when you need it.  We recommend signing up for the patient portal called "MyChart".  Sign up information is provided on this After Visit Summary.  MyChart is used to connect with patients for Virtual Visits (Telemedicine).  Patients are able to view lab/test results, encounter notes, upcoming appointments, etc.  Non-urgent messages can be sent to your provider as well.   To learn more about what you can do with MyChart, go to [email or web address].    Your next appointment:   3 month(s)  The format for your next appointment:   In Person  Provider:   Randall An, PA-C   Other Instructions ZIO AT  Call [facility] company with any questions during wear 24/7: [phone] Zio patch should be worn for 14 days unless otherwise instructed The [employer] ([employer]) will call you 24-48 hrs. after [employer] is applied, be sure to answer the phone from a 224 area code.   They may call during wear with important information regarding your heart, so answer any calls from [employer] Do not shower for 24 hours after Zio is applied (sponge bath is fine, just keep the patch dry) After that, when showering avoid direct shower stream of water onto Zio patch, pat dry around the patch Avoid excessive sweating Push the button when you are feeling any cardiac symptoms and record them in the logbook or on the Zio app Refer to the removal date listed on the front of the symptom logbook and remove Zio patch according to the instructions in the back of the logbook Place Zio patch inside transmitter (sticky side facing up, button facing down) and put both the gateway and symptom logbook in the prepaid mailing envelope included in the back of the transmitter.  Place inside mailbox or any USPS mailbox

## 2020-09-13 ENCOUNTER — Telehealth: Payer: Self-pay | Admitting: Cardiology

## 2020-09-13 NOTE — Telephone Encounter (Signed)
Calling for monitor results    Please call [phone]

## 2020-09-13 NOTE — Telephone Encounter (Signed)
Will forward to Dr.Branch to dispo.

## 2020-09-14 MED ORDER — METOPROLOL SUCCINATE ER 25 MG PO TB24: 37.5000 mg | ORAL_TABLET | Freq: Every day | ORAL | 3 refills | Status: DC

## 2020-09-14 NOTE — Telephone Encounter (Signed)
Monitor results given to patient.She reports more palpitations and asks that we increase her toprol to 37.5 mg daily, e-scribed to [facility] [address]

## 2020-09-14 NOTE — Telephone Encounter (Signed)
Heart monitor just shows some occasional extra heart beats, sometimes can have a few in a row. This is nothing dangerous but can cause palpitations. If toprol is controlling the symptoms can continue, if symptoms remain bother some can increase dose to 37.5mg  daily   Dominga Ferry MD

## 2020-10-25 NOTE — Progress Notes (Deleted)
Cardiology Office Note    Date:  10/25/2020   ID:  Denise Cochran, DOB [birth year], MRN [id number]  PCP:  Benita Stabile, MD  Cardiologist: None    No chief complaint on file.   History of Present Illness:    Denise Cochran is a 40 y.o. female with past medical history of palpitations (rare PAC's and PVC's by prior monitor in 2017) and anemia who presents to the office today for 36-month follow-up.  She was examined by Dr. Wyline Mood in 07/2020 and reported occasional palpitations over the past several years which could last for a few minutes and spontaneously resolve. A 7-day Zio patch was recommended for further evaluation and she was encouraged to reduce her caffeine intake. Her monitor showed predominantly normal sinus rhythm with isolated PAC's and PVC's along with 1 run of SVT lasting for 7 beats. She had been on Toprol-XL 25 mg daily and it was recommended that if symptoms were bothersome to her, this could be further titrated to 37.5 mg daily.    Past Medical History:  Diagnosis Date   Anemia    Heartburn in pregnancy     Past Surgical History:  Procedure Laterality Date   CESAREAN SECTION     CESAREAN SECTION N/A 05/20/2012   Procedure: CESAREAN SECTION;  Surgeon: Mickel Baas, MD;  Location: [facility];  Service: Obstetrics;  Laterality: N/A;   DILATION AND EVACUATION N/A 05/20/2012   Procedure: DILATATION AND EVACUATION;  Surgeon: Mickel Baas, MD;  Location: [facility];  Service: Gynecology;  Laterality: N/A;   TUBAL LIGATION Bilateral 05/20/2012   Procedure: BILATERAL TUBAL LIGATION;  Surgeon: Mickel Baas, MD;  Location: [facility];  Service: Obstetrics;  Laterality: Bilateral;    Current Medications: Outpatient Medications Prior to Visit  Medication Sig Dispense Refill   amLODipine (NORVASC) 5 MG tablet Take 5 mg by mouth daily.     Azelastine-Fluticasone 137-50 MCG/ACT SUSP Place 1 spray into both nostrils daily.     losartan (COZAAR) 25 MG tablet Take 25 mg by mouth  daily.     metoprolol succinate (TOPROL XL) 25 MG 24 hr tablet Take 1.5 tablets (37.5 mg total) by mouth daily. 135 tablet 3   No facility-administered medications prior to visit.     Allergies:   Methergine [methylergonovine maleate]   Social History   Socioeconomic History   Marital status: Married    Spouse name: Not on file   Number of children: Not on file   Years of education: Not on file   Highest education level: Not on file  Occupational History   Not on file  Tobacco Use   Smoking status: Never   Smokeless tobacco: Never  Substance and Sexual Activity   Alcohol use: No   Drug use: No   Sexual activity: Yes    Birth control/protection: None  Other Topics Concern   Not on file  Social History Narrative   Not on file   Social Determinants of Health   Financial Resource Strain: Not on file  Food Insecurity: Not on file  Transportation Needs: Not on file  Physical Activity: Not on file  Stress: Not on file  Social Connections: Not on file     Family History:  The patient's ***family history includes Sudden death (age of onset: 67) in her brother.   Review of Systems:    Please see the history of present illness.     All other systems reviewed and are otherwise negative except as  noted above.   Physical Exam:    VS:  There were no vitals taken for this visit.   General: Well developed, well nourished,female appearing in no acute distress. Head: Normocephalic, atraumatic. Neck: No carotid bruits. JVD not elevated.  Lungs: Respirations regular and unlabored, without wheezes or rales.  Heart: ***Regular rate and rhythm. No S3 or S4.  No murmur, no rubs, or gallops appreciated. Abdomen: Appears non-distended. No obvious abdominal masses. Msk:  Strength and tone appear normal for age. No obvious joint deformities or effusions. Extremities: No clubbing or cyanosis. No edema.  Distal pedal pulses are 2+ bilaterally. Neuro: Alert and oriented X 3. Moves all  extremities spontaneously. No focal deficits noted. Psych:  Responds to questions appropriately with a normal affect. Skin: No rashes or lesions noted  Wt Readings from Last 3 Encounters:  07/26/20 220 lb (99.8 kg)  11/10/16 215 lb (97.5 kg)  02/10/14 196 lb 3.4 oz (89 kg)        Studies/Labs Reviewed:   EKG:  EKG is*** ordered today.  The ekg ordered today demonstrates ***  Recent Labs: No results found for requested labs within last 8760 hours.   Lipid Panel No results found for: CHOL, TRIG, HDL, CHOLHDL, VLDL, LDLCALC, LDLDIRECT  Additional studies/ records that were reviewed today include:   Echocardiogram: 01/2014 Study Conclusions   - Left ventricle: The cavity size was normal. Systolic function was    normal. The estimated ejection fraction was in the range of 55%    to 60%. Wall motion was normal; there were no regional wall    motion abnormalities. Left ventricular diastolic function    parameters were normal.    Event Monitor: 09/2020 7 day monitor Rare supraventricular ectopy in the form of isolated PACs and couplets. Isolated run of SVT lasting 7 beats, with 1.6 post conversion pause Rare ventricular ectopy in the form of isolated PVCs There were patient triggered events but no a diary was not submitted indicating symptoms.     Patch Wear Time:  6 days and 9 hours (2022-07-14T13:50:28-0400 to 2022-07-20T23:45:35-0400)   Patient had a min HR of 41 bpm, max HR of 133 bpm, and avg HR of 74 bpm. Predominant underlying rhythm was Sinus Rhythm. 1 run of Supraventricular Tachycardia occurred lasting 7 beats with a max rate of 112 bpm (avg 98 bpm). Isolated SVEs were rare  (<1.0%), SVE Couplets were rare (<1.0%), and no SVE Triplets were present. Isolated VEs were rare (<1.0%), and no VE Couplets or VE Triplets were present.   Assessment:    No diagnosis found.   Plan:   In order of problems listed above:  ***    Shared Decision Making/Informed  Consent:   {Are you ordering a CV Procedure (e.g. stress test, cath, DCCV, TEE, etc)?   Press F2        :[id number]}    Medication Adjustments/Labs and Tests Ordered: Current medicines are reviewed at length with the patient today.  Concerns regarding medicines are outlined above.  Medication changes, Labs and Tests ordered today are listed in the Patient Instructions below. There are no Patient Instructions on file for this visit.   Signed, Ellsworth Lennox, PA-C  10/25/2020 11:36 AM    [facility] [address] [place], [place] [address] Phone: [phone] Fax: [phone]

## 2020-10-26 ENCOUNTER — Ambulatory Visit: Payer: BC Managed Care – PPO | Admitting: Student

## 2020-12-04 NOTE — Progress Notes (Signed)
Cardiology Office Note    Date:  12/05/2020   ID:  Denise Cochran, DOB 12/26/1980, MRN [id number]  PCP:  Benita Stabile, MD  Cardiologist: Dina Rich, MD    Chief Complaint  Patient presents with   Follow-up    4 month visit     History of Present Illness:    Denise Cochran is a 40 y.o. female with past medical history of palpitations (rare PAC's and PVC's by prior monitor in 2017) and HTN who presents to the office today for 45-month follow-up.   She was examined by Dr. Wyline Mood in 07/2020 and reported occasional palpitations over the past several years which could last for a few minutes and spontaneously resolve. A 7-day Zio patch was recommended for further evaluation and she was encouraged to reduce her caffeine intake. Her monitor showed predominantly normal sinus rhythm with isolated PAC's and PVC's along with 1 run of SVT lasting for 7 beats. She had been on Toprol-XL 25 mg daily and it was recommended that if symptoms were bothersome to her, this could be further titrated to 37.5 mg daily.  In talking with the patient today, she reports still having intermittent palpitations and states she did resume taking Toprol-XL 25 mg once daily as she had fatigue with the higher dosing. Reports her palpitations are more of a skipping sensation. Denies any associated chest pain or dyspnea on exertion. No recent orthopnea, PND or lower extremity edema.   She does consume two 20-ounce bottles of [facility] daily along with 2 glasses of sweet tea. No alcohol use.    Past Medical History:  Diagnosis Date   Anemia    Heartburn in pregnancy    Palpitations    a. PAC's and PVC's by prior monitor in 2017    Past Surgical History:  Procedure Laterality Date   CESAREAN SECTION     CESAREAN SECTION N/A 05/20/2012   Procedure: CESAREAN SECTION;  Surgeon: Mickel Baas, MD;  Location: [facility];  Service: Obstetrics;  Laterality: N/A;   DILATION AND EVACUATION N/A 05/20/2012   Procedure:  DILATATION AND EVACUATION;  Surgeon: Mickel Baas, MD;  Location: [facility];  Service: Gynecology;  Laterality: N/A;   TUBAL LIGATION Bilateral 05/20/2012   Procedure: BILATERAL TUBAL LIGATION;  Surgeon: Mickel Baas, MD;  Location: [facility];  Service: Obstetrics;  Laterality: Bilateral;    Current Medications: Outpatient Medications Prior to Visit  Medication Sig Dispense Refill   amLODipine (NORVASC) 5 MG tablet Take 5 mg by mouth daily.     Azelastine-Fluticasone 137-50 MCG/ACT SUSP Place 1 spray into both nostrils daily.     losartan (COZAAR) 100 MG tablet Take 100 mg by mouth daily.     metoprolol succinate (TOPROL XL) 25 MG 24 hr tablet Take 1.5 tablets (37.5 mg total) by mouth daily. 135 tablet 3   losartan (COZAAR) 25 MG tablet Take 25 mg by mouth daily. (Patient not taking: Reported on 12/05/2020)     No facility-administered medications prior to visit.     Allergies:   Methergine [methylergonovine maleate]   Social History   Socioeconomic History   Marital status: Married    Spouse name: Not on file   Number of children: Not on file   Years of education: Not on file   Highest education level: Not on file  Occupational History   Not on file  Tobacco Use   Smoking status: Never   Smokeless tobacco: Never  Vaping Use   Vaping  Use: Never used  Substance and Sexual Activity   Alcohol use: No   Drug use: No   Sexual activity: Yes    Birth control/protection: None  Other Topics Concern   Not on file  Social History Narrative   Not on file   Social Determinants of Health   Financial Resource Strain: Not on file  Food Insecurity: Not on file  Transportation Needs: Not on file  Physical Activity: Not on file  Stress: Not on file  Social Connections: Not on file     Family History:  The patient's family history includes Sudden death (age of onset: 54) in her brother.   Review of Systems:    Please see the history of present illness.     All other systems  reviewed and are otherwise negative except as noted above.   Physical Exam:    VS:  BP 128/80   Pulse 84   Ht 5\' 7"  (1.702 m)   Wt 220 lb (99.8 kg)   SpO2 99%   BMI 34.46 kg/m    General: Well developed, well nourished,female appearing in no acute distress. Head: Normocephalic, atraumatic. Neck: No carotid bruits. JVD not elevated.  Lungs: Respirations regular and unlabored, without wheezes or rales.  Heart: Regular rate and rhythm. No S3 or S4.  No murmur, no rubs, or gallops appreciated. Abdomen: Appears non-distended. No obvious abdominal masses. Msk:  Strength and tone appear normal for age. No obvious joint deformities or effusions. Extremities: No clubbing or cyanosis. No pitting edema.  Distal pedal pulses are 2+ bilaterally. Neuro: Alert and oriented X 3. Moves all extremities spontaneously. No focal deficits noted. Psych:  Responds to questions appropriately with a normal affect. Skin: No rashes or lesions noted  Wt Readings from Last 3 Encounters:  12/05/20 220 lb (99.8 kg)  07/26/20 220 lb (99.8 kg)  11/10/16 215 lb (97.5 kg)     Studies/Labs Reviewed:   EKG:  EKG is not ordered today.   Recent Labs: No results found for requested labs within last 8760 hours.   Lipid Panel No results found for: CHOL, TRIG, HDL, CHOLHDL, VLDL, LDLCALC, LDLDIRECT  Additional studies/ records that were reviewed today include:   Echocardiogram: 01/2014 Study Conclusions   - Left ventricle: The cavity size was normal. Systolic function was    normal. The estimated ejection fraction was in the range of 55%    to 60%. Wall motion was normal; there were no regional wall    motion abnormalities. Left ventricular diastolic function    parameters were normal.    Event Monitor: 07/2020 7 day monitor Rare supraventricular ectopy in the form of isolated PACs and couplets. Isolated run of SVT lasting 7 beats, with 1.6 post conversion pause Rare ventricular ectopy in the form of  isolated PVCs There were patient triggered events but no a diary was not submitted indicating symptoms.  Assessment:    1. Palpitations   2. Essential hypertension      Plan:   In order of problems listed above:  1. Palpitations - Prior monitor in 2017 showed rare PAC's and PVC's and recent repeat monitor in 07/2020 showed predominantly normal sinus rhythm with rare PAC's and PVC's with less than 1% burden and 1 run of SVT for 7 beats. - She does continue to experience intermittent palpitations and we reviewed that she could try taking her Toprol-XL 37.5 mg at night to see if she is better able to tolerate this in regards to fatigue. If  not, can continue with 25 mg in the morning as this has helped with her symptoms. She does report having an upcoming visit with her PCP and will have routine labs at that time. We also reviewed the importance of reducing caffeine intake.  2. HTN - Her blood pressure is well-controlled at 128/80 during today's visit. Continue current medication regimen with Amlodipine 5 mg daily, Losartan 100 mg daily and Toprol-XL 37.5 mg daily   Medication Adjustments/Labs and Tests Ordered: Current medicines are reviewed at length with the patient today.  Concerns regarding medicines are outlined above.  Medication changes, Labs and Tests ordered today are listed in the Patient Instructions below. Patient Instructions  Medication Instructions:  Your physician recommends that you continue on your current medications as directed. Please refer to the Current Medication list given to you today.  *If you need a refill on your cardiac medications before your next appointment, please call your pharmacy*   Lab Work: NONE   If you have labs (blood work) drawn today and your tests are completely normal, you will receive your results only by: MyChart Message (if you have MyChart) OR A paper copy in the mail If you have any lab test that is abnormal or we need to change  your treatment, we will call you to review the results.   Testing/Procedures: NONE    Follow-Up: At [facility], you and your health needs are our priority.  As part of our continuing mission to provide you with exceptional heart care, we have created designated Provider Care Teams.  These Care Teams include your primary Cardiologist (physician) and Advanced Practice Providers (APPs -  Physician Assistants and Nurse Practitioners) who all work together to provide you with the care you need, when you need it.  We recommend signing up for the patient portal called "MyChart".  Sign up information is provided on this After Visit Summary.  MyChart is used to connect with patients for Virtual Visits (Telemedicine).  Patients are able to view lab/test results, encounter notes, upcoming appointments, etc.  Non-urgent messages can be sent to your provider as well.   To learn more about what you can do with MyChart, go to [email or web address].    Your next appointment:   1 year(s)  The format for your next appointment:   In Person  Provider:   Dina Rich, MD    Other Instructions Thank you for choosing [facility]!     Signed, Ellsworth Lennox, PA-C  12/05/2020 1:43 PM    [facility] [address] [place], [place] [address] Phone: [phone] Fax: [phone]

## 2020-12-05 ENCOUNTER — Encounter: Payer: Self-pay | Admitting: Student

## 2020-12-05 ENCOUNTER — Other Ambulatory Visit: Payer: Self-pay

## 2020-12-05 ENCOUNTER — Ambulatory Visit (INDEPENDENT_AMBULATORY_CARE_PROVIDER_SITE_OTHER): Payer: BC Managed Care – PPO | Admitting: Student

## 2020-12-05 VITALS — BP 128/80 | HR 84 | Ht 67.0 in | Wt 220.0 lb

## 2020-12-05 DIAGNOSIS — I10 Essential (primary) hypertension: Secondary | ICD-10-CM

## 2020-12-05 DIAGNOSIS — R002 Palpitations: Secondary | ICD-10-CM | POA: Diagnosis not present

## 2020-12-05 MED ORDER — METOPROLOL SUCCINATE ER 25 MG PO TB24: 37.5000 mg | ORAL_TABLET | Freq: Every day | ORAL | 3 refills | Status: DC

## 2020-12-05 NOTE — Patient Instructions (Signed)
Medication Instructions:  Your physician recommends that you continue on your current medications as directed. Please refer to the Current Medication list given to you today.  *If you need a refill on your cardiac medications before your next appointment, please call your pharmacy*   Lab Work: NONE   If you have labs (blood work) drawn today and your tests are completely normal, you will receive your results only by: . MyChart Message (if you have MyChart) OR . A paper copy in the mail If you have any lab test that is abnormal or we need to change your treatment, we will call you to review the results.   Testing/Procedures: NONE    Follow-Up: At [facility], you and your health needs are our priority.  As part of our continuing mission to provide you with exceptional heart care, we have created designated Provider Care Teams.  These Care Teams include your primary Cardiologist (physician) and Advanced Practice Providers (APPs -  Physician Assistants and Nurse Practitioners) who all work together to provide you with the care you need, when you need it.  We recommend signing up for the patient portal called "MyChart".  Sign up information is provided on this After Visit Summary.  MyChart is used to connect with patients for Virtual Visits (Telemedicine).  Patients are able to view lab/test results, encounter notes, upcoming appointments, etc.  Non-urgent messages can be sent to your provider as well.   To learn more about what you can do with MyChart, go to [email or web address].    Your next appointment:   1 year(s)  The format for your next appointment:   In Person  Provider:   Jonathan Branch, MD   Other Instructions Thank you for choosing [facility]!    

## 2022-02-10 ENCOUNTER — Other Ambulatory Visit: Payer: Self-pay | Admitting: Student

## 2022-02-12 ENCOUNTER — Other Ambulatory Visit: Payer: Self-pay | Admitting: Student

## 2022-02-20 ENCOUNTER — Other Ambulatory Visit: Payer: Self-pay | Admitting: *Deleted

## 2022-02-20 MED ORDER — METOPROLOL SUCCINATE ER 25 MG PO TB24: ORAL_TABLET | ORAL | 0 refills | Status: DC

## 2022-03-21 ENCOUNTER — Other Ambulatory Visit: Payer: Self-pay | Admitting: Cardiology

## 2022-04-30 ENCOUNTER — Encounter: Payer: Self-pay | Admitting: Cardiology

## 2022-04-30 ENCOUNTER — Ambulatory Visit: Payer: BC Managed Care – PPO | Attending: Cardiology | Admitting: Cardiology

## 2022-04-30 VITALS — BP 118/78 | HR 56 | Ht 66.0 in | Wt 214.0 lb

## 2022-04-30 DIAGNOSIS — I10 Essential (primary) hypertension: Secondary | ICD-10-CM

## 2022-04-30 DIAGNOSIS — R002 Palpitations: Secondary | ICD-10-CM | POA: Diagnosis not present

## 2022-04-30 MED ORDER — METOPROLOL SUCCINATE ER 25 MG PO TB24: ORAL_TABLET | ORAL | 3 refills | Status: DC

## 2022-04-30 NOTE — Progress Notes (Signed)
[name] is a 42 y.o.female seen today for follow up of the following medical problems.   Palpitations - several year history, recent increase frequency - can have racing or pounding of beat, fluttering. Lasts a few minutes. Occurs few times a day. No specific trigger. Infrequent dizziness - no coffee. Mountain dew cans x 1-2, sweet tea daily x 2 glasses. No energy drinks, no EtoH - has been on metoprolol for a few years   - 2017 holter SR, rare PACs and PVCs  07/2020 monitor: rare PACs, isolated run SVT 7 beats with 1.6 second post conversion pause, rare PVCs  - mild symptoms at times, occurs 2-3 times a week. Lasts few seconds. No set pattern - compliant with meds  2. HTN - compliant with meds.    SH:  Works Marketing executive Has 3 daughters ages 45 yo twins, 15 Upcoming trips to [place] and [place] with her kids   Past Medical History:  Diagnosis Date   Anemia    Heartburn in pregnancy    Palpitations    a. PAC's and PVC's by prior monitor in 2017     Allergies  Allergen Reactions   Methergine [Methylergonovine Maleate]     Seizure activity     Current Outpatient Medications  Medication Sig Dispense Refill   amLODipine (NORVASC) 5 MG tablet Take 5 mg by mouth daily.     Azelastine-Fluticasone 137-50 MCG/ACT SUSP Place 1 spray into both nostrils daily.     losartan (COZAAR) 100 MG tablet Take 100 mg by mouth daily.     metoprolol succinate (TOPROL-XL) 25 MG 24 hr tablet TAKE 1 & 1/2 (ONE & ONE-HALF) TABLETS BY MOUTH ONCE DAILY 45 tablet 0   No current facility-administered medications for this visit.     Past Surgical History:  Procedure Laterality Date   CESAREAN SECTION     CESAREAN SECTION N/A 05/20/2012   Procedure: CESAREAN SECTION;  Surgeon: Mickel Baas, MD;  Location: [facility];  Service: Obstetrics;  Laterality: N/A;   DILATION AND EVACUATION N/A 05/20/2012   Procedure: DILATATION AND EVACUATION;  Surgeon: Mickel Baas,  MD;  Location: [facility];  Service: Gynecology;  Laterality: N/A;   TUBAL LIGATION Bilateral 05/20/2012   Procedure: BILATERAL TUBAL LIGATION;  Surgeon: Mickel Baas, MD;  Location: [facility];  Service: Obstetrics;  Laterality: Bilateral;     Allergies  Allergen Reactions   Methergine [Methylergonovine Maleate]     Seizure activity      Family History  Problem Relation Age of Onset   Sudden death Brother 34     Social History Denise Cochran reports that she has never smoked. She has never used smokeless tobacco. Denise Cochran reports no history of alcohol use.   Review of Systems CONSTITUTIONAL: No weight loss, fever, chills, weakness or fatigue.  HEENT: Eyes: No visual loss, blurred vision, double vision or yellow sclerae.No hearing loss, sneezing, congestion, runny nose or sore throat.  SKIN: No rash or itching.  CARDIOVASCULAR: per hpi RESPIRATORY: No shortness of breath, cough or sputum.  GASTROINTESTINAL: No anorexia, nausea, vomiting or diarrhea. No abdominal pain or blood.  GENITOURINARY: No burning on urination, no polyuria NEUROLOGICAL: No headache, dizziness, syncope, paralysis, ataxia, numbness or tingling in the extremities. No change in bowel or bladder control.  MUSCULOSKELETAL: No muscle, back pain, joint pain or stiffness.  LYMPHATICS: No enlarged nodes. No history of splenectomy.  PSYCHIATRIC: No history of depression or anxiety.  ENDOCRINOLOGIC: No reports of sweating, cold or heat intolerance. No polyuria or polydipsia.  Marland Kitchen   Physical Examination Today's Vitals   04/30/22 0841  BP: 118/78  Pulse: (!) 56  SpO2: 98%  Weight: 214 lb (97.1 kg)  Height:  (1.676 m)   Body mass index is 34.54 kg/m.  Gen: resting comfortably, no acute distress HEENT: no scleral icterus, pupils equal round and reactive, no palptable cervical adenopathy,  CV: RRR, no m/r,g no jvd Resp: Clear to auscultation bilaterally GI: abdomen is soft, non-tender, non-distended, normal  bowel sounds, no hepatosplenomegaly MSK: extremities are warm, no edema.  Skin: warm, no rash Neuro:  no focal deficits Psych: appropriate affect   Diagnostic Studies  Jan 2016 echo Study Conclusions   - Left ventricle: The cavity size was normal. Systolic function was    normal. The estimated ejection fraction was in the range of 55%    to 60%. Wall motion was normal; there were no regional wall    motion abnormalities. Left ventricular diastolic function    parameters were normal.   08/2015 holter 48 hour Holter monitor reviewed. Sinus rhythm present throughout. Heart rate ranged from 52 bpm up to 125 bpm with average heart rate 77 bpm. Rare PVCs and PACs noted (>1% total beats) with no sustained arrhythmias. There were no pauses.  07/2020 monitor 7 day monitor Rare supraventricular ectopy in the form of isolated PACs and couplets. Isolated run of SVT lasting 7 beats, with 1.6 post conversion pause Rare ventricular ectopy in the form of isolated PVCs There were patient triggered events but no a diary was not submitted indicating symptoms.   Assessment and Plan   Palpitations - overall doing well on metoprolol, continue current meds - EKG today shows sinus brady 56  2. HTN - at goal, continue current meds  F/u 1 year  Antoine Poche, M.D.

## 2022-04-30 NOTE — Patient Instructions (Signed)
Medication Instructions:  Your physician recommends that you continue on your current medications as directed. Please refer to the Current Medication list given to you today.  *If you need a refill on your cardiac medications before your next appointment, please call your pharmacy*   Lab Work: None If you have labs (blood work) drawn today and your tests are completely normal, you will receive your results only by: MyChart Message (if you have MyChart) OR A paper copy in the mail If you have any lab test that is abnormal or we need to change your treatment, we will call you to review the results.   Testing/Procedures: None   Follow-Up: At  [facility], you and your health needs are our priority.  As part of our continuing mission to provide you with exceptional heart care, we have created designated Provider Care Teams.  These Care Teams include your primary Cardiologist (physician) and Advanced Practice Providers (APPs -  Physician Assistants and Nurse Practitioners) who all work together to provide you with the care you need, when you need it.  We recommend signing up for the patient portal called "MyChart".  Sign up information is provided on this After Visit Summary.  MyChart is used to connect with patients for Virtual Visits (Telemedicine).  Patients are able to view lab/test results, encounter notes, upcoming appointments, etc.  Non-urgent messages can be sent to your provider as well.   To learn more about what you can do with MyChart, go to [email or web address].    Your next appointment:   1 year(s)  Provider:   Jonathan Branch, MD    Other Instructions    

## 2022-04-30 NOTE — Addendum Note (Signed)
Addended by: Leonides Schanz C on: 04/30/2022 09:05 AM   Modules accepted: Orders

## 2022-05-30 ENCOUNTER — Other Ambulatory Visit: Payer: Self-pay | Admitting: Cardiology

## 2023-06-30 NOTE — Progress Notes (Signed)
 Cardiology Office Note    Date:  07/01/2023  ID:  Denise Cochran, DOB 20-Feb-1980, MRN [id number] Cardiologist: Armida Lander, MD    History of Present Illness:    Denise Cochran is a 43 y.o. female with past medical history of palpitations (rare PAC's and PVC's by prior monitor in 2017 and brief episodes of SVT by monitor in 07/2020) and HTN who presents to the office today for annual follow-up.  She was last examined by Dr. Amanda Jungling in 04/2022 and reported occasional palpitations but symptoms would only last for a few seconds. Denied any other symptoms and she was continued on Toprol -XL 37.5 mg daily and Losartan 100 mg daily.  In talking with the patient today, she reports still having frequent palpitations which typically occur several times a day but only last for a few seconds and spontaneously resolve. She is unaware of any specific triggers. She has reduced her caffeine intake and only consumes one [employer] soda a day. Does not consume coffee, tea or energy drinks. No alcohol use. No recent orthopnea, PND or pitting edema.  Labs were checked by her PCP last year and reassuring at that time and she is scheduled for follow-up lab work next month. 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 She does have 4 children (83 yo, 46 yo and 55 yo twins).  Studies Reviewed:   EKG: EKG is ordered today and demonstrates:   EKG Interpretation Date/Time:  Wednesday July 01 2023 08:27:56 EDT Ventricular Rate:  77 PR Interval:  154 QRS Duration:  88 QT Interval:  394 QTC Calculation: 445 R Axis:   3  Text Interpretation: Normal sinus rhythm with sinus arrhythmia Normal ECG Confirmed by Woodfin Hays ([id number]) on 07/01/2023 8:42:36 AM       Echocardiogram: 01/2014 Study Conclusions   - Left ventricle: The cavity size was normal. Systolic function was    normal. The estimated ejection fraction was in the range  of 55%    to 60%. Wall motion was normal; there were no regional wall    motion abnormalities. Left ventricular diastolic function    parameters were normal.   Event Monitor: 07/2020 7 day monitor Rare supraventricular ectopy in the form of isolated PACs and couplets. Isolated run of SVT lasting 7 beats, with 1.6 post conversion pause Rare ventricular ectopy in the form of isolated PVCs There were patient triggered events but no a diary was not submitted indicating symptoms.     Patch Wear Time:  6 days and 9 hours (2022-07-14T13:50:28-0400 to 2022-07-20T23:45:35-0400)   Patient had a min HR of 41 bpm, max HR of 133 bpm, and avg HR of 74 bpm. Predominant underlying rhythm was Sinus Rhythm. 1 run of Supraventricular Tachycardia occurred lasting 7 beats with a max rate of 112 bpm (avg 98 bpm). Isolated SVEs were rare  (<1.0%), SVE Couplets were rare (<1.0%), and no SVE Triplets were present. Isolated VEs were rare (<1.0%), and no VE Couplets or VE Triplets were present.   Physical Exam:   VS:  BP 132/74   Pulse 77   Ht 5' 7 (1.702 m)   Wt 220 lb 9.6 oz (100.1 kg)   SpO2 97%   BMI 34.55 kg/m    Wt Readings from Last 3 Encounters:  07/01/23 220 lb 9.6 oz (100.1 kg)  04/30/22 214 lb (97.1 kg)  12/05/20 220 lb (99.8 kg)     GEN:  Pleasant female appearing in no acute distress NECK: No JVD; No carotid bruits CARDIAC: RRR, no murmurs, rubs, gallops RESPIRATORY:  Clear to auscultation without rales, wheezing or rhonchi  ABDOMEN: Appears non-distended. No obvious abdominal masses. EXTREMITIES: No clubbing or cyanosis. No pitting edema.  Distal pedal pulses are 2+ bilaterally.   Assessment and Plan:   1. Palpitations - Prior monitor in 2022 showed brief episodes of SVT along with rare PAC's and PVC's but no significant arrhythmias.  - She continues to have frequent episodes of palpitations but they are overall brief and spontaneously resolve. No specific triggers. She is scheduled  follow-up labs next month with her PCP. Reviewed options with the patient and will titrate Toprol -XL from 37.5 mg daily to 50 mg daily given HR in the 70's. I encouraged her to reach out if no improvement in symptoms as this could be further titrated or switched to a different beta-blocker.  2. Essential hypertension - BP was initially recorded at 140/84, rechecked and improved to 132/74. Continue Losartan 100 mg daily and will titrate Toprol -XL to 50 mg daily as discussed above.  Signed, Denise Gash, PA-C

## 2023-07-01 ENCOUNTER — Ambulatory Visit: Attending: Student | Admitting: Student

## 2023-07-01 ENCOUNTER — Encounter: Payer: Self-pay | Admitting: Student

## 2023-07-01 VITALS — BP 132/74 | HR 77 | Ht 67.0 in | Wt 220.6 lb

## 2023-07-01 DIAGNOSIS — R002 Palpitations: Secondary | ICD-10-CM | POA: Diagnosis not present

## 2023-07-01 DIAGNOSIS — I10 Essential (primary) hypertension: Secondary | ICD-10-CM

## 2023-07-01 MED ORDER — METOPROLOL SUCCINATE ER 50 MG PO TB24: 50.0000 mg | ORAL_TABLET | Freq: Every day | ORAL | 3 refills | Status: AC

## 2023-07-01 NOTE — Patient Instructions (Signed)
 Medication Instructions:  Your physician has recommended you make the following change in your medication:  Please Increase Toprol  XL to 50 Mg daily   Labwork: None   Testing/Procedures: None   Follow-Up: Your physician recommends that you schedule a follow-up appointment in: 1 Year   Any Other Special Instructions Will Be Listed Below (If Applicable).  If you need a refill on your cardiac medications before your next appointment, please call your pharmacy.

## 2023-08-19 ENCOUNTER — Other Ambulatory Visit (HOSPITAL_COMMUNITY): Payer: Self-pay | Admitting: Nurse Practitioner

## 2023-08-19 DIAGNOSIS — E010 Iodine-deficiency related diffuse (endemic) goiter: Secondary | ICD-10-CM

## 2023-08-27 ENCOUNTER — Ambulatory Visit (HOSPITAL_COMMUNITY)
Admission: RE | Admit: 2023-08-27 | Discharge: 2023-08-27 | Disposition: A | Discharge status: Home / Self Care | Source: Ambulatory Visit | Attending: Nurse Practitioner | Admitting: Nurse Practitioner

## 2023-08-27 DIAGNOSIS — E010 Iodine-deficiency related diffuse (endemic) goiter: Secondary | ICD-10-CM | POA: Diagnosis present
